# Patient Record
Sex: Male | Born: 1985 | Race: White | Hispanic: No | Marital: Single | Smoking: Current some day smoker
Health system: Southern US, Community
[De-identification: ages and names within clinical notes are randomized; demographics above are authoritative.]

## PROBLEM LIST (undated history)

## (undated) DIAGNOSIS — S022XXA Fracture of nasal bones, initial encounter for closed fracture: Secondary | ICD-10-CM

## (undated) DIAGNOSIS — J45909 Unspecified asthma, uncomplicated: Secondary | ICD-10-CM

## (undated) DIAGNOSIS — S0291XA Unspecified fracture of skull, initial encounter for closed fracture: Secondary | ICD-10-CM

## (undated) DIAGNOSIS — K2970 Gastritis, unspecified, without bleeding: Secondary | ICD-10-CM

## (undated) DIAGNOSIS — Z9889 Other specified postprocedural states: Secondary | ICD-10-CM

## (undated) HISTORY — PX: KNEE SURGERY: SHX244

---
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
## 2007-09-30 ENCOUNTER — Encounter: Payer: Self-pay | Admitting: Emergency Medicine

## 2007-09-30 ENCOUNTER — Inpatient Hospital Stay (HOSPITAL_COMMUNITY): Admission: EM | Admit: 2007-09-30 | Discharge: 2007-10-01 | Payer: Self-pay | Admitting: Neurosurgery

## 2007-10-10 ENCOUNTER — Encounter: Admission: RE | Admit: 2007-10-10 | Discharge: 2007-10-10 | Payer: Self-pay | Admitting: Obstetrics and Gynecology

## 2010-02-26 ENCOUNTER — Emergency Department: Payer: Self-pay | Admitting: Internal Medicine

## 2010-05-12 ENCOUNTER — Ambulatory Visit: Payer: Self-pay | Admitting: Family Medicine

## 2010-05-12 DIAGNOSIS — F17200 Nicotine dependence, unspecified, uncomplicated: Secondary | ICD-10-CM | POA: Insufficient documentation

## 2010-06-22 ENCOUNTER — Telehealth (INDEPENDENT_AMBULATORY_CARE_PROVIDER_SITE_OTHER): Payer: Self-pay

## 2010-07-04 NOTE — Assessment & Plan Note (Signed)
Summary: RASH, SORE THROAT/EVM   Vital Signs:  Patient Profile:   25 Years Old Male Height:     64.5 inches Weight:      149 pounds BMI:     25.27 O2 Sat:      100 % O2 treatment:    Room Air Temp:     97.9 degrees F oral Pulse rate:   70 / minute Pulse rhythm:   regular Resp:     18 per minute BP sitting:   134 / 86  (right arm)  Pt. in pain?   no  Vitals Entered By: Levonne Spiller EMT-P (May 12, 2010 4:00 PM)              Is Patient Diabetic? No  Does patient need assistance? Functional Status Self care Ambulation Normal Comments Pt. is a smoker. 1 half packs per day.      Current Allergies: ! * PEANUTSHistory of Present Illness History from: patient Reason for visit: see chief complaint Chief Complaint: cough, rash, sore throat History of Present Illness: This patient is a 25 year old smoker who is presenting today with a couple of complaints.  He says that he was treated for a URI several weeks ago but the symptoms initially started getting better and now they are coming back worse.   He says that he took a course of Zithromax.  He is a smoker and asthmatic and reports that he is wheezing and coughing especially at night.  He says that he is using his albuterol inhaler occasionally but not using it every day.  He says that the inhaler usually does stop the coughing.  He also reports that he has developed  a genital rash.  He reports that he has been having an itchy red irritated rash in the groin between the legs and buttock area. He says that it becomes severely itchy after a shower.  He says that he has been putting some A and D ointment on the area but only minimal relief in symptoms.  He says that he works in a [employer] and he sweats a lot and works for 12 hours at a time.      REVIEW OF SYSTEMS Constitutional Symptoms      Denies fever, chills, night sweats, weight loss, weight gain, and fatigue.  Eyes       Denies change in vision, eye pain, eye  discharge, glasses, contact lenses, and eye surgery. Ear/Nose/Throat/Mouth       Complains of frequent runny nose and sore throat.      Denies hearing loss/aids, change in hearing, ear pain, ear discharge, dizziness, frequent nose bleeds, sinus problems, hoarseness, and tooth pain or bleeding.  Respiratory       Complains of productive cough and asthma.      Denies dry cough, wheezing, shortness of breath, bronchitis, and emphysema/COPD.      Comments: Colored Sputum Cardiovascular       Denies murmurs, chest pain, and tires easily with exhertion.    Gastrointestinal       Denies stomach pain, nausea/vomiting, diarrhea, constipation, blood in bowel movements, and indigestion. Genitourniary       Denies painful urination, kidney stones, and loss of urinary control. Neurological       Complains of headaches.      Denies paralysis, seizures, and fainting/blackouts. Musculoskeletal       Complains of muscle pain and joint pain.      Denies joint stiffness, decreased  range of motion, redness, swelling, muscle weakness, and gout.  Skin       Denies bruising, unusual mles/lumps or sores, and hair/skin or nail changes.  Psych       Denies mood changes, temper/anger issues, anxiety/stress, speech problems, depression, and sleep problems.  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 He is engaged to be married.  He smokes 1/2 packs per day of cigarrettes. No Alcohol useage No Recreational Drug Useage Pt does report a history of cocaine use in the past.  Past Medical History: Asthma Chronic Active Nicotine User Previous Head Injury - Pt describes that he cracked the back of skull in accident and now has muscle spasms in the back of the neck.  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 He is engaged to be married.  He smokes 1/2 packs per day of cigarrettes. No Alcohol useage No Recreational Drug Useage Pt does report a history of cocaine use in the past. Physical Exam General appearance: well developed, well nourished, no acute distress Head: normocephalic, atraumatic Eyes: conjunctivae and lids normal Pupils: equal, round, reactive to light Ears: scarred right TM Nasal: red, boggy, swollen nasal turbinates bilateral Oral/Pharynx: pharyngeal erythema without exudate, uvula midline without deviation Neck: neck supple,  trachea midline, no masses Chest/Lungs: large tattoo on chest wall, no rales, wheezes, or rhonchi bilateral, breath sounds equal without effort Heart: regular rate and  rhythm, no murmur Abdomen: soft, non-tender without obvious organomegaly GU: no abnormal lesions seen  Extremities: normal extremities Neurological: grossly intact and non-focal Skin: candidal yeast rash in the intertriginal area between groin and legs MSE: oriented to time, place, and person Assessment New Problems: ACUTE SINUSITIS, UNSPECIFIED (ICD-461.9) INTERTRIGO, CANDIDAL (ICD-695.89) CIGARETTE SMOKER (ICD-305.1) ACUTE BRONCHITIS (ICD-466.0)   Plan New Medications/Changes: VENTOLIN HFA 108 (90 BASE) MCG/ACT AERS (ALBUTEROL SULFATE) 2 puffs every 4 hours as needed for wheezing, cough and shortness of breath  #1 x 3, 05/12/2010, Clanford Johnson MD DOXYCYCLINE HYCLATE 100 MG TABS (DOXYCYCLINE HYCLATE) take 1 tab by mouth two times a day with food until gone to treat infection  #20 x 0, 05/12/2010, Clanford Johnson MD DELSYM 30 MG/5ML LQCR (DEXTROMETHORPHAN POLISTIREX) take 1 teaspoon by mouth every 12 hours as needed for severe cough  #60 ML x 0, 05/12/2010, Clanford Johnson  MD KETOCONAZOLE 2 % CREA (KETOCONAZOLE) apply to rash twice per day as directed  #60 gm x 1, 05/12/2010, Clanford Johnson MD FLUCONAZOLE 100 MG TABS (FLUCONAZOLE) take 1 tab by mouth daily until completed  #5 x 0, 05/12/2010, Clanford Johnson MD   The patient and/or caregiver has been counseled thoroughly with regard to medications prescribed including dosage, schedule, interactions, rationale for use, and possible side effects and they verbalize understanding.  Diagnoses and expected course of recovery discussed and will return if not improved as expected or if the condition worsens. Patient and/or caregiver verbalized understanding.  Prescriptions: VENTOLIN HFA 108 (90 BASE) MCG/ACT AERS (ALBUTEROL SULFATE) 2 puffs every 4 hours as needed for wheezing, cough and shortness of breath  #1 x 3   Entered and Authorized by:  Standley Dakins MD   Signed by:   Standley Dakins MD on 05/12/2010   Method used:   Electronically to        [facility] [address] (retail)       [address], [address]       [place], [place]  [address]       Ph: [phone]       Fax: [phone]   RxID:   [id number] DOXYCYCLINE HYCLATE 100 MG TABS (DOXYCYCLINE HYCLATE) take 1 tab by mouth two times a day with food until gone to treat infection  #20 x 0   Entered and Authorized by:   Standley Dakins MD   Signed by:   Standley Dakins MD on 05/12/2010   Method used:   Electronically to        [facility] [address] (retail)       [address], [address]       [place], [place]  [address]       Ph: [phone]       Fax: [phone]   RxID:   [id number] DELSYM 30 MG/5ML LQCR (DEXTROMETHORPHAN POLISTIREX) take 1 teaspoon by mouth every 12 hours as needed for severe cough  #60 ML x 0   Entered and Authorized by:   Standley Dakins MD   Signed by:   Standley Dakins MD on 05/12/2010   Method used:   Electronically to        [facility] [address] (retail)       [address], [address]       [place], [place]  [address]       Ph: [phone]       Fax: [phone]   RxID:   [id number] KETOCONAZOLE 2 % CREA (KETOCONAZOLE) apply to rash twice per day as directed  #60 gm x 1   Entered and Authorized by:   Standley Dakins MD   Signed by:   Standley Dakins MD on 05/12/2010   Method used:   Electronically to        [facility] [address] (retail)       [address], [address]       [place], [place]  [address]       Ph: [phone]       Fax: [phone]   RxID:   [id number] FLUCONAZOLE 100 MG TABS (FLUCONAZOLE) take 1 tab by mouth daily until completed  #5 x 0   Entered and Authorized by:   Standley Dakins MD   Signed by:   Standley Dakins MD on 05/12/2010   Method used:   Electronically to        [facility] [address] (retail)       [address], [address]       [place], [place]  [address]       Ph: [phone]       Fax: [phone]   RxID:   [id number]   Patient Instructions: 1)  Tobacco is very bad for your health and your loved ones! You Should stop smoking!. 2)  Stop Smoking Tips: Choose a Quit date. Cut down before the Quit date. decide what you will do as a substitute when you  feel the urge to smoke(gum,toothpick,exercise). 3)  Please call if symptoms are not improving and return to have it rechecked. 4)  The patient was informed that there is no on-call provider or services available at this clinic during off-hours (when the clinic is closed).  If the patient developed a problem or concern that required immediate attention, the patient was advised to go the the nearest available urgent care or emergency department for medical care.  The patient verbalized understanding.    5)  The risks, benefits and possible side effects were clearly explained and discussed with the patient.  The patient verbalized clear  understanding.  The patient was given instructions to return if symptoms don't improve, worsen or new changes develop.  If it is not during clinic hours and the patient cannot get back to this clinic then the patient was told to seek medical care at an available urgent care or emergency department.  The patient verbalized understanding.   6)  Acute sinusitis symptoms for less than 10 days are not helped by antibiotics.Use warm moist compresses, and over the counter decongestants ( only as directed). Call if no improvement in 5-7 days, sooner if increasing pain, fever, or new symptoms. 7)  Acute bronchitis symptoms for less than 10 days are not helped by antibiotics. take over the counter cough medications. call if no improvment in  5-7 days, sooner if increasing cough, fever, or new symptoms( shortness of breath, chest pain).  Medication Administration  Medication # 1:    Medication: ipratropium inhalation sol unit 0.5mg     Diagnosis: asthma    Dose: 1    Route: inhaled    Exp Date: 10/02/2010    Lot #: [id number]    Patient tolerated medication without complications    Given by: Levonne Spiller EMT-P (May 12, 2010 4:21 PM)  Medication # 2:    Medication: ABUTEROL SULFATE 3.0MG     Diagnosis: ASTHMA    Dose: 1    Route: inhaled    Exp Date: 10/02/2010    Lot #: [id number]    Patient tolerated medication without complications    Given by: Levonne Spiller EMT-P (May 12, 2010 4:22 PM) Pt's breathing improved after the treatment and lungs were clear to auscultation.

## 2010-07-04 NOTE — Letter (Signed)
Summary: Out of Work  [facility]  [address]   [place], [place] [address]   Phone: [phone]  Fax: [phone]    May 12, 2010   Employee:  [name] Roth    To Whom It May Concern:   For Medical reasons, please excuse the above named employee from work for the following dates:  Start:   May 12, 2010  End:   May 15, 2010  If you need additional information, please feel free to contact our office.         Sincerely,    Standley Dakins MD

## 2010-07-06 NOTE — Progress Notes (Signed)
  Phone Note Call from Patient   Reason for Call: Refill Medication Summary of Call: Pt. called and asked if he could get a regular Albuterol inhaler Rx. The Ventolin HFA is fine, However it costs 40.00. Please advise. Initial call taken by: Levonne Spiller EMT-P,  June 22, 2010 10:15 AM  Follow-up for Phone Call        OK to call in albuterol generic. Follow-up by: J. Juline Patch MD,  June 22, 2010 11:08 AM  Additional Follow-up for Phone Call Additional follow up Details #1::        Unable to give regular Albuterol inhaler due to it no longer on the market. Pt. was notified. Additional Follow-up by: Levonne Spiller EMT-P,  June 22, 2010 11:16 AM
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
## 2010-11-01 ENCOUNTER — Ambulatory Visit: Payer: Self-pay

## 2010-11-21 ENCOUNTER — Ambulatory Visit: Payer: Self-pay | Admitting: Unknown Physician Specialty

## 2010-12-13 ENCOUNTER — Emergency Department: Payer: Self-pay | Admitting: Emergency Medicine

## 2010-12-31 ENCOUNTER — Emergency Department: Payer: Self-pay | Admitting: *Deleted

## 2011-02-07 ENCOUNTER — Emergency Department: Payer: Self-pay | Admitting: Emergency Medicine

## 2011-02-08 ENCOUNTER — Inpatient Hospital Stay (HOSPITAL_COMMUNITY)
Admission: AD | Admit: 2011-02-08 | Discharge: 2011-02-12 | DRG: 885 | Disposition: A | Discharge status: Home / Self Care | Payer: Commercial Managed Care - PPO | Source: Ambulatory Visit | Attending: Psychiatry | Admitting: Psychiatry

## 2011-02-08 DIAGNOSIS — G43909 Migraine, unspecified, not intractable, without status migrainosus: Secondary | ICD-10-CM

## 2011-02-08 DIAGNOSIS — F1920 Other psychoactive substance dependence, uncomplicated: Secondary | ICD-10-CM

## 2011-02-08 DIAGNOSIS — Z91010 Allergy to peanuts: Secondary | ICD-10-CM

## 2011-02-08 DIAGNOSIS — F17200 Nicotine dependence, unspecified, uncomplicated: Secondary | ICD-10-CM

## 2011-02-08 DIAGNOSIS — F3110 Bipolar disorder, current episode manic without psychotic features, unspecified: Secondary | ICD-10-CM

## 2011-02-08 DIAGNOSIS — F1020 Alcohol dependence, uncomplicated: Secondary | ICD-10-CM

## 2011-02-08 DIAGNOSIS — F410 Panic disorder [episodic paroxysmal anxiety] without agoraphobia: Secondary | ICD-10-CM

## 2011-02-08 DIAGNOSIS — F429 Obsessive-compulsive disorder, unspecified: Secondary | ICD-10-CM

## 2011-02-08 DIAGNOSIS — Z87892 Personal history of anaphylaxis: Secondary | ICD-10-CM

## 2011-02-08 DIAGNOSIS — F0781 Postconcussional syndrome: Secondary | ICD-10-CM

## 2011-02-08 DIAGNOSIS — J45909 Unspecified asthma, uncomplicated: Secondary | ICD-10-CM
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
## 2011-11-30 ENCOUNTER — Emergency Department (HOSPITAL_COMMUNITY)
Admission: EM | Admit: 2011-11-30 | Discharge: 2011-11-30 | Disposition: A | Discharge status: Home / Self Care | Payer: Commercial Managed Care - PPO | Attending: Emergency Medicine | Admitting: Emergency Medicine

## 2011-11-30 ENCOUNTER — Emergency Department (HOSPITAL_COMMUNITY): Payer: Commercial Managed Care - PPO

## 2011-11-30 ENCOUNTER — Encounter (HOSPITAL_COMMUNITY): Payer: Self-pay | Admitting: *Deleted

## 2011-11-30 DIAGNOSIS — F17200 Nicotine dependence, unspecified, uncomplicated: Secondary | ICD-10-CM | POA: Insufficient documentation

## 2011-11-30 DIAGNOSIS — Z91010 Allergy to peanuts: Secondary | ICD-10-CM | POA: Insufficient documentation

## 2011-11-30 DIAGNOSIS — J45909 Unspecified asthma, uncomplicated: Secondary | ICD-10-CM | POA: Insufficient documentation

## 2011-11-30 DIAGNOSIS — S61209A Unspecified open wound of unspecified finger without damage to nail, initial encounter: Secondary | ICD-10-CM | POA: Insufficient documentation

## 2011-11-30 DIAGNOSIS — S6000XA Contusion of unspecified finger without damage to nail, initial encounter: Secondary | ICD-10-CM

## 2011-11-30 DIAGNOSIS — S61219A Laceration without foreign body of unspecified finger without damage to nail, initial encounter: Secondary | ICD-10-CM

## 2011-11-30 HISTORY — DX: Unspecified asthma, uncomplicated: J45.909

## 2011-11-30 HISTORY — DX: Unspecified fracture of skull, initial encounter for closed fracture: S02.91XA

## 2011-11-30 HISTORY — DX: Other specified postprocedural states: Z98.890

## 2011-11-30 HISTORY — DX: Fracture of nasal bones, initial encounter for closed fracture: S02.2XXA

## 2011-11-30 MED ORDER — HYDROCODONE-ACETAMINOPHEN 5-325 MG PO TABS
2.0000 | ORAL_TABLET | Freq: Once | ORAL | Status: AC
  Administered 2011-11-30: 2 via ORAL
  Filled 2011-11-30: qty 2
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
## 2011-11-30 NOTE — ED Notes (Signed)
Patient is sleeping well. Wakes up to deep stimuli. Patient is able to stand and get into wheelchair. The patient is d/c'd to home with mother
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
## 2014-12-12 ENCOUNTER — Emergency Department (HOSPITAL_COMMUNITY): Payer: Self-pay

## 2014-12-12 ENCOUNTER — Emergency Department (HOSPITAL_COMMUNITY)
Admission: EM | Admit: 2014-12-12 | Discharge: 2014-12-12 | Disposition: A | Discharge status: Home / Self Care | Payer: Self-pay | Attending: Emergency Medicine | Admitting: Emergency Medicine

## 2014-12-12 ENCOUNTER — Emergency Department (HOSPITAL_COMMUNITY): Payer: Commercial Managed Care - PPO

## 2014-12-12 ENCOUNTER — Encounter (HOSPITAL_COMMUNITY): Payer: Self-pay | Admitting: Emergency Medicine

## 2014-12-12 DIAGNOSIS — Z720 Tobacco use: Secondary | ICD-10-CM | POA: Insufficient documentation

## 2014-12-12 DIAGNOSIS — Z9889 Other specified postprocedural states: Secondary | ICD-10-CM | POA: Insufficient documentation

## 2014-12-12 DIAGNOSIS — R109 Unspecified abdominal pain: Secondary | ICD-10-CM

## 2014-12-12 DIAGNOSIS — F1010 Alcohol abuse, uncomplicated: Secondary | ICD-10-CM | POA: Insufficient documentation

## 2014-12-12 DIAGNOSIS — J45909 Unspecified asthma, uncomplicated: Secondary | ICD-10-CM | POA: Insufficient documentation

## 2014-12-12 DIAGNOSIS — Z8781 Personal history of (healed) traumatic fracture: Secondary | ICD-10-CM | POA: Insufficient documentation

## 2014-12-12 DIAGNOSIS — K2920 Alcoholic gastritis without bleeding: Secondary | ICD-10-CM | POA: Insufficient documentation

## 2014-12-12 DIAGNOSIS — K759 Inflammatory liver disease, unspecified: Secondary | ICD-10-CM | POA: Insufficient documentation

## 2014-12-12 LAB — URINALYSIS, ROUTINE W REFLEX MICROSCOPIC
Bilirubin Urine: NEGATIVE
Glucose, UA: NEGATIVE mg/dL
Hgb urine dipstick: NEGATIVE
LEUKOCYTES UA: NEGATIVE
NITRITE: NEGATIVE
SPECIFIC GRAVITY, URINE: 1.015 (ref 1.005–1.030)
UROBILINOGEN UA: 0.2 mg/dL (ref 0.0–1.0)
pH: 7 (ref 5.0–8.0)

## 2014-12-12 LAB — COMPREHENSIVE METABOLIC PANEL
ALBUMIN: 4.2 g/dL (ref 3.5–5.0)
ALK PHOS: 74 U/L (ref 38–126)
ALT: 121 U/L — ABNORMAL HIGH (ref 17–63)
ANION GAP: 11 (ref 5–15)
AST: 61 U/L — ABNORMAL HIGH (ref 15–41)
BUN: 8 mg/dL (ref 6–20)
CO2: 24 mmol/L (ref 22–32)
CREATININE: 0.81 mg/dL (ref 0.61–1.24)
Calcium: 9.1 mg/dL (ref 8.9–10.3)
Chloride: 101 mmol/L (ref 101–111)
GFR calc Af Amer: >60 mL/min (ref >60)
GFR calc non Af Amer: >60 mL/min (ref >60)
Glucose, Bld: 192 mg/dL — ABNORMAL HIGH (ref 65–99)
POTASSIUM: 3.9 mmol/L (ref 3.5–5.1)
Sodium: 136 mmol/L (ref 135–145)
TOTAL PROTEIN: 8.1 g/dL (ref 6.5–8.1)
Total Bilirubin: 0.4 mg/dL (ref 0.3–1.2)

## 2014-12-12 LAB — LIPASE, BLOOD: Lipase: 17 U/L — ABNORMAL LOW (ref 22–51)

## 2014-12-12 LAB — CBC WITH DIFFERENTIAL/PLATELET
BASOS PCT: 1 % (ref 0–1)
Basophils Absolute: 0.1 10*3/uL (ref 0.0–0.1)
EOS ABS: 0.1 10*3/uL (ref 0.0–0.7)
Eosinophils Relative: 1 % (ref 0–5)
HCT: 47.1 % (ref 39.0–52.0)
Hemoglobin: 16.7 g/dL (ref 13.0–17.0)
LYMPHS ABS: 2.6 10*3/uL (ref 0.7–4.0)
Lymphocytes Relative: 21 % (ref 12–46)
MCH: 32.3 pg (ref 26.0–34.0)
MCHC: 35.5 g/dL (ref 30.0–36.0)
MCV: 91.1 fL (ref 78.0–100.0)
MONO ABS: 0.6 10*3/uL (ref 0.1–1.0)
Monocytes Relative: 5 % (ref 3–12)
NEUTROS ABS: 8.8 10*3/uL — AB (ref 1.7–7.7)
NEUTROS PCT: 72 % (ref 43–77)
Platelets: 219 10*3/uL (ref 150–400)
RBC: 5.17 MIL/uL (ref 4.22–5.81)
RDW: 13.5 % (ref 11.5–15.5)
WBC: 12.2 10*3/uL — AB (ref 4.0–10.5)

## 2014-12-12 LAB — URINE MICROSCOPIC-ADD ON

## 2014-12-12 LAB — ETHANOL

## 2014-12-12 MED ORDER — MORPHINE SULFATE 4 MG/ML IJ SOLN
4.0000 mg | Freq: Once | INTRAMUSCULAR | Status: AC
  Administered 2014-12-12: 4 mg via INTRAVENOUS
  Filled 2014-12-12: qty 1

## 2014-12-12 MED ORDER — GI COCKTAIL ~~LOC~~
30.0000 mL | Freq: Once | ORAL | Status: AC
  Administered 2014-12-12: 30 mL via ORAL

## 2014-12-12 MED ORDER — PANTOPRAZOLE SODIUM 40 MG PO TBEC
40.0000 mg | DELAYED_RELEASE_TABLET | Freq: Once | ORAL | Status: AC
  Administered 2014-12-12: 40 mg via ORAL
  Filled 2014-12-12: qty 1

## 2014-12-12 MED ORDER — ONDANSETRON HCL 4 MG/2ML IJ SOLN
4.0000 mg | Freq: Once | INTRAMUSCULAR | Status: DC
  Filled 2014-12-12: qty 2

## 2014-12-12 MED ORDER — SODIUM CHLORIDE 0.9 % IV BOLUS (SEPSIS)
1000.0000 mL | Freq: Once | INTRAVENOUS | Status: AC
  Administered 2014-12-12: 1000 mL via INTRAVENOUS

## 2014-12-12 MED ORDER — OMEPRAZOLE 20 MG PO CPDR: 20.0000 mg | DELAYED_RELEASE_CAPSULE | Freq: Two times a day (BID) | ORAL | Status: DC

## 2014-12-12 MED ORDER — TRAMADOL HCL 50 MG PO TABS
50.0000 mg | ORAL_TABLET | Freq: Four times a day (QID) | ORAL | Status: DC | PRN
Start: 2014-12-12 — End: 2018-11-30

## 2014-12-12 MED ORDER — FAMOTIDINE IN NACL 20-0.9 MG/50ML-% IV SOLN
20.0000 mg | Freq: Once | INTRAVENOUS | Status: AC
  Administered 2014-12-12: 20 mg via INTRAVENOUS
  Filled 2014-12-12: qty 50

## 2014-12-12 MED ORDER — ONDANSETRON HCL 4 MG/2ML IJ SOLN
4.0000 mg | Freq: Once | INTRAMUSCULAR | Status: AC
  Administered 2014-12-12: 4 mg via INTRAVENOUS

## 2014-12-12 MED ORDER — GI COCKTAIL ~~LOC~~
ORAL | Status: AC
  Filled 2014-12-12: qty 30

## 2014-12-12 MED ORDER — PROMETHAZINE HCL 25 MG PO TABS: 25.0000 mg | ORAL_TABLET | Freq: Four times a day (QID) | ORAL | Status: DC | PRN

## 2014-12-12 NOTE — ED Notes (Signed)
Patient with abdominal pain and vomiting x 3 days. States burning chest pain x 2 days. Patient reports hematemesis x 3 days as well. Has been taking pepto-bismol.

## 2014-12-12 NOTE — ED Notes (Signed)
C/o pain coming back after ultrasound.  Orders received.

## 2014-12-12 NOTE — Discharge Instructions (Signed)
Gastritis, Adult Gastritis is soreness and swelling (inflammation) of the lining of the stomach. Gastritis can develop as a sudden onset (acute) or long-term (chronic) condition. If gastritis is not treated, it can lead to stomach bleeding and ulcers. CAUSES  Gastritis occurs when the stomach lining is weak or damaged. Digestive juices from the stomach then inflame the weakened stomach lining. The stomach lining may be weak or damaged due to viral or bacterial infections. One common bacterial infection is the Helicobacter pylori infection. Gastritis can also result from excessive alcohol consumption, taking certain medicines, or having too much acid in the stomach.  SYMPTOMS  In some cases, there are no symptoms. When symptoms are present, they may include:  Pain or a burning sensation in the upper abdomen.  Nausea.  Vomiting.  An uncomfortable feeling of fullness after eating. DIAGNOSIS  Your caregiver may suspect you have gastritis based on your symptoms and a physical exam. To determine the cause of your gastritis, your caregiver may perform the following:  Blood or stool tests to check for the H pylori bacterium.  Gastroscopy. A thin, flexible tube (endoscope) is passed down the esophagus and into the stomach. The endoscope has a light and camera on the end. Your caregiver uses the endoscope to view the inside of the stomach.  Taking a tissue sample (biopsy) from the stomach to examine under a microscope. TREATMENT  Depending on the cause of your gastritis, medicines may be prescribed. If you have a bacterial infection, such as an H pylori infection, antibiotics may be given. If your gastritis is caused by too much acid in the stomach, H2 blockers or antacids may be given. Your caregiver may recommend that you stop taking aspirin, ibuprofen, or other nonsteroidal anti-inflammatory drugs (NSAIDs). HOME CARE INSTRUCTIONS  Only take over-the-counter or prescription medicines as directed by  your caregiver.  If you were given antibiotic medicines, take them as directed. Finish them even if you start to feel better.  Drink enough fluids to keep your urine clear or pale yellow.  Avoid foods and drinks that make your symptoms worse, such as:  Caffeine or alcoholic drinks.  Chocolate.  Peppermint or mint flavorings.  Garlic and onions.  Spicy foods.  Citrus fruits, such as oranges, lemons, or limes.  Tomato-based foods such as sauce, chili, salsa, and pizza.  Fried and fatty foods.  alcohol  Eat small, frequent meals instead of large meals. SEEK IMMEDIATE MEDICAL CARE IF:   You have black or dark red stools.  You vomit blood or material that looks like coffee grounds.  You are unable to keep fluids down.  Your abdominal pain gets worse.  You have a fever.  You do not feel better after 1 week.  You have any other questions or concerns. MAKE SURE YOU:  Understand these instructions.  Will watch your condition.  Will get help right away if you are not doing well or get worse. Document Released: 05/15/2001 Document Revised: 11/20/2011 Document Reviewed: 07/04/2011 [facility] Patient Information 2015 [place], [place]. This information is not intended to replace advice given to you by your health care provider. Make sure you discuss any questions you have with your health care provider.    In addition to the prilosec (which should help heal your stomach lining) you may also use maalox or mylanta for additional gastritis relief.   Call your primary doctor for a recheck of your symptoms this week.  You may need to see a Gastroenterologist for further tests if your  symptoms persist.  See the resource guide below if you feel you have a problem with alcohol and are interested in assistance with this problem.    Emergency Department Resource Guide 1) Find a Doctor and Pay Out of Pocket Although you won't have to find out who is covered by your insurance plan, it  is a good idea to ask around and get recommendations. You will then need to call the office and see if the doctor you have chosen will accept you as a new patient and what types of options they offer for patients who are self-pay. Some doctors offer discounts or will set up payment plans for their patients who do not have insurance, but you will need to ask so you aren't surprised when you get to your appointment.  2) Contact Your Local Health Department Not all health departments have doctors that can see patients for sick visits, but many do, so it is worth a call to see if yours does. If you don't know where your local health department is, you can check in your phone book. The CDC also has a tool to help you locate your state's health department, and many state websites also have listings of all of their local health departments.  3) Find a Walk-in Clinic If your illness is not likely to be very severe or complicated, you may want to try a walk in clinic. These are popping up all over the country in pharmacies, drugstores, and shopping centers. They're usually staffed by nurse practitioners or physician assistants that have been trained to treat common illnesses and complaints. They're usually fairly quick and inexpensive. However, if you have serious medical issues or chronic medical problems, these are probably not your best option.  No Primary Care Doctor: - Call Health Connect at  [phone] - they can help you locate a primary care doctor that  accepts your insurance, provides certain services, etc. - Physician Referral Service- [phone]  Chronic Pain Problems: Organization         Address  Phone   Notes  [facility]  [phone] Patients need to be referred by their primary care doctor.   Medication Assistance: Organization         Address  Phone   Notes  [facility] Medication [facility] [address] [place], [place] [address] [phone] --Must be a resident of [facility] -- Must have NO insurance coverage whatsoever (no Medicaid/ Medicare, etc.) -- The pt. MUST have a primary care doctor that directs their care regularly and follows them in the community   [facility]  [phone]   [employer]  [phone]    Agencies that provide inexpensive medical care: Organization         Address  Phone   Notes  [facility]  [phone]   [facility]    [phone]   [facility] [address] [place], [place] [address] [phone]   [facility] [address] [place], [place] [phone]   Planned Parenthood    [phone]   [facility]    [phone]   [facility]  [address], [place] Phone:  [phone], Fax:  [phone] Hours of Operation:  9 am - 6 pm, M-F.  Also accepts Medicaid/Medicare and self-pay.  [facility]  [address], [place] Phone: [phone], Fax: [phone]. Hours of Operation:  8:30 am - 5:30 pm, M-F.  Also accepts Medicaid and self-pay.  [facility] [address], [place] Phone: [phone]   [place], [place], [place] [phone], Ext. [phone] Mondays & Thursdays: 7-9 AM.  First 15 patients are seen on a first come, first serve basis.    [place] Providers:  Organization         Address  Phone   Notes  [facility] [address], [place] [phone] Also accepts self-pay patients.  [facility] [address] [place] [address], [place]  [phone]   [place], [address], [place] [phone]   [facility] [address] [place] [phone]   Lucianne Lei [address], [place]   [phone] Only accepts [place] Access [place] patients after they have their name applied to their card.   Self-Pay (no insurance) in [facility]:  Organization         Address  Phone   Notes  Sickle Cell Patients, [facility] [place] [phone]   [facility] [place] [phone]   [facility] [place]  [place], [place], [place] [phone]   [facility]/Dr. Osei-Bonsu  [address] [place] or [address], [place] [phone] Phone number for both [place] and [place] locations is the same.  [facility] [address], [place] [phone]   [facility] [address], [place] or [address] [phone]   [facility] [address] [place] [phone], phone; [phone], fax Sees patients 1st and 3rd Saturday of every month.  Must not qualify for public or private insurance (i.e. Medicaid, Medicare, [place] Health Choice, Veterans' Benefits)  Household income should be no more than 200% of the poverty level The clinic cannot treat you if you are pregnant or think you are pregnant  Sexually transmitted diseases are not treated at the clinic.    Dental Care: Organization         Address  Phone  Notes  [facility] Department of [facility] [phone] Accepts children up to age 82 who are enrolled in [place] or [place]; pregnant women with a Medicaid card; and children who have applied for Medicaid or [place] Health Choice, but were declined, whose parents can pay a reduced fee at time of service.  [facility] Department of [facility]  [address], [place] [phone] Accepts children up to age 75 who are enrolled in [place] or [place]; pregnant women with a Medicaid card; and children who have applied for Medicaid or [place] Health Choice, but were declined, whose parents can pay a reduced fee at time of service.  [facility]  [place] [phone] Patients are seen by appointment only. Walk-ins are not accepted. [place] will see patients 68 years of age and older. Monday - Tuesday (8am-5pm) Most Wednesdays (8:30-5pm) $30 per visit, cash only  [place]  [address], [facility] [phone] Patients are seen by appointment only. Walk-ins are not accepted. [place] will see patients 41 years of age and older. One Wednesday Evening (Monthly: Volunteer Based).  $30 per visit, cash only  [place]  [phone] for adults; Children under age 20, call Graduate Pediatric Dentistry at [phone]. Children aged 80-14, please call [phone] to request a pediatric application.  Dental services are provided in all areas of dental care including fillings, crowns and bridges, complete and partial dentures, implants, gum treatment, root canals, and extractions. Preventive care is also provided. Treatment is provided to both adults and children. Patients are selected via a lottery and there is often a waiting list.   [facility] [address] [place]  [phone] [email or web address]   [facility] [address] [place], [place] [phone], Ext. [phone] Second and Fourth Thursday of each month, opens at 6:30 AM; Clinic ends at 9 AM.  Patients are seen on a first-come first-served basis, and a limited number are seen during each clinic.   [facility]  [address] Hillard Danker [place], [place] [phone]   Eligibility Requirements You must have lived in [place], [place], or [place] counties for at least the last three months.   You cannot be eligible for state or  federal sponsored [employer], including [employer], [place], or [employer].   You generally cannot be eligible for healthcare insurance through your employer.    How to apply: Eligibility screenings are held every Tuesday and Wednesday afternoon from 1:00 pm until 4:00 pm. You do not need an appointment for the interview!  [facility] [address] [place], [place]   [place]  [place] Department  [place]  [phone]    Behavioral Health Resources in the Community: Intensive Outpatient Programs Organization         Address  Phone  Notes  [place]. [address], [place], [place] [phone]   [facility] [address] [place], [place]   ADS: Alcohol & Drug Svcs [address], [place], [place]   [place] [address]  [place], [place] or [phone]   Substance Abuse Resources Organization         Address  Phone  Notes  Alcohol and Drug Services  [phone]   [place]  [phone]   [facility] [place]   Chinita Pester  [phone]   Residential & Outpatient Substance Abuse Program  [phone]   Psychological Services Organization         Address  Phone  Notes  [facility] [place]  [place]  [phone]   [place] [address], [place] or [phone]    Mobile Crisis Teams Organization         Address  Phone  Notes  [facility], Mobile Crisis Care Unit  [phone]   [facility]  [address] [place], [place]   [place] [address], [place] [phone]    Self-Help/Support Groups Organization         Address  Phone              Notes  [place]. of [place] -  variety of support groups  336- ([phone] Call for more information  Narcotics Anonymous (NA), [facility] [address], [employer] [place]  2 meetings at this location   Residential Sports administrator         Address  Phone  Notes  [facility] [address],    [place]  [phone]   [facility]  [address], [place] [address], [place], [place] [phone]   [facility] [address] [place], [place] [phone] Admissions: 8am-3pm M-F  [facility] [address]    [place], [place] [phone]   [facility] [address] [place], [place], [place] [phone]   [facility] [address]  [place], [place] [phone]   [facility] - Intensive Outpatient [address], [place], [place] [phone]   [facility] ([facility] [address]  [place], [place] [phone] or [phone]   [facility] (RTS) [address], [place], [place] [phone] Accepts Medicaid  [facility] [address],  [place] [phone] Substance Abuse/Addiction Treatment   [facility] Organization         Address  Phone  Notes  [facility]  [phone]   Angie Fava, PhD [address] Ervin Knack [place], [place]   [phone] or [phone]   [facility] Behavioral   [address] [place], [place] [phone]   [facility] [address], [place], [place] [phone] Insurance/Medicaid/sponsorship through [facility] and Families [address]                                    [place], [place] [phone] Therapy/tele-psych/case  [facility] [address], [place] [phone]    Dr. Lolly Mustache  [phone]   [facility]  [employer] [facility]  Dept. 1) [address] [place] 2) [address] [place] 3)  [address], [place] [phone]  [phone]   [facility] Child Abuse Hotline [phone])[phone] or [phone] (After Hours)

## 2014-12-12 NOTE — ED Provider Notes (Signed)
CSN: [id number]     Arrival date & time 12/12/14  0845 History   First MD Initiated Contact with Patient 12/12/14 0900     Chief Complaint  Patient presents with  . Abdominal Pain     (Consider location/radiation/quality/duration/timing/severity/associated sxs/prior Treatment) The history is provided by the patient and a friend.   [name] Roth is a 29 y.o. male the past medical history of asthma and history of EtOH abuse, although denies this currently, presenting with a three-day history of upper abdominal pain along with nausea and vomiting.  He describes midsternal chest burning pain in association with vomiting.  He has been vomiting clear liquid, however has had episodic hematemesis as well.  He has tried Pepto-Bismol without relief.  He has had no fevers or chills.  Pain is constant in his upper abdomen and radiates into his back.  He reports increased personal stressors this week hence increased alcohol use.  He states he has had no less than 10 shots of alcohol daily for the past week, last use was yesterday.  He denies history of pancreatitis.    Past Medical History  Diagnosis Date  . Skull fracture   . Broken nose   . History of right knee surgery   . Asthma    Past Surgical History  Procedure Laterality Date  . Knee surgery      right   No family history on file. History  Substance Use Topics  . Smoking status: Current Some Day Smoker -- 1.00 packs/day    Types: Cigarettes  . Smokeless tobacco: Not on file  . Alcohol Use: Yes     Comment: states daily drinking    Review of Systems  Constitutional: Negative for fever.  HENT: Negative for congestion and sore throat.   Eyes: Negative.   Respiratory: Negative for chest tightness and shortness of breath.   Cardiovascular: Negative for chest pain.  Gastrointestinal: Positive for nausea, vomiting and abdominal pain. Negative for diarrhea, constipation and blood in stool.       Episodic hematemesis  Genitourinary:  Negative.   Musculoskeletal: Negative for joint swelling, arthralgias and neck pain.  Skin: Negative.  Negative for rash and wound.  Neurological: Positive for weakness. Negative for dizziness, light-headedness, numbness and headaches.  Psychiatric/Behavioral: Negative.       Allergies  Peanut-containing drug products  Home Medications   Prior to Admission medications   Medication Sig Start Date End Date Taking? Authorizing Provider  ibuprofen (ADVIL,MOTRIN) 200 MG tablet Take 400 mg by mouth every 6 (six) hours as needed for moderate pain.   Yes Historical Provider, MD  omeprazole (PRILOSEC) 20 MG capsule Take 1 capsule (20 mg total) by mouth 2 (two) times daily before a meal. 12/12/14   Burgess AmorJulie Tidus [place], PA-C  promethazine (PHENERGAN) 25 MG tablet Take 1 tablet (25 mg total) by mouth every 6 (six) hours as needed for nausea or vomiting. 12/12/14   Burgess AmorJulie Jandel [place], PA-C  traMADol (ULTRAM) 50 MG tablet Take 1 tablet (50 mg total) by mouth every 6 (six) hours as needed. 12/12/14   Burgess AmorJulie Jarita Raval, PA-C   BP 125/78 mmHg  Pulse 69  Temp(Src) 99.2 F (37.3 C) (Oral)  Resp 16  Ht 5\' 6"  (1.676 m)  Wt 145 lb (65.772 kg)  BMI 23.41 kg/m2  SpO2 97% Physical Exam  Constitutional: He appears well-developed and well-nourished.  Actively retching with clear liquid vomitus  HENT:  Head: Normocephalic and atraumatic.  Neck: Normal range of motion. Neck supple.  Cardiovascular: Normal rate, regular rhythm, normal heart sounds and intact distal pulses.   Pulmonary/Chest: Effort normal and breath sounds normal. He has no wheezes.  Abdominal: Soft. Bowel sounds are normal. He exhibits no distension. There is generalized tenderness. There is no guarding.  Patient endorses pain is worse in his left upper abdomen.  On exam he has generalized tenderness without guarding.  Musculoskeletal: Normal range of motion.  Neurological: He is alert.  Skin: Skin is warm and dry.  Psychiatric: He has a normal mood and affect.   Nursing note and vitals reviewed.   ED Course  Procedures (including critical care time) Labs Review Labs Reviewed  CBC WITH DIFFERENTIAL/PLATELET - Abnormal; Notable for the following:    WBC 12.2 (*)    Neutro Abs 8.8 (*)    All other components within normal limits  COMPREHENSIVE METABOLIC PANEL - Abnormal; Notable for the following:    Glucose, Bld 192 (*)    AST 61 (*)    ALT 121 (*)    All other components within normal limits  LIPASE, BLOOD - Abnormal; Notable for the following:    Lipase 17 (*)    All other components within normal limits  URINALYSIS, ROUTINE W REFLEX MICROSCOPIC (NOT AT [facility]) - Abnormal; Notable for the following:    Ketones, ur TRACE (*)    Protein, ur TRACE (*)    All other components within normal limits  ETHANOL  URINE MICROSCOPIC-ADD ON    Imaging Review US Abdomen Complete  12/12/2014   CLINICAL DATA:  29 year old male with acute abdominal pain and vomiting.  EXAM: ULTRASOUND ABDOMEN COMPLETE  COMPARISON:  None.  FINDINGS: Gallbladder: Gallbladder is unremarkable. There is no evidence of cholelithiasis or acute cholecystitis.  Common bile duct: Diameter: 3.8 mm. There is no evidence of intrahepatic or extrahepatic biliary dilatation. The visualized CBD is unremarkable.  Liver: No focal lesion identified. Within normal limits in parenchymal echogenicity.  IVC: No abnormality visualized.  Pancreas: Visualized portion unremarkable.  Spleen: Size and appearance within normal limits.  Right Kidney: Length: 12.4 cm. Echogenicity within normal limits. No mass or hydronephrosis visualized.  Left Kidney: Length: 11.9 cm. Echogenicity within normal limits. No mass or hydronephrosis visualized.  Abdominal aorta: No aneurysm visualized.  Other findings: None.  IMPRESSION: Normal abdominal ultrasound.   Electronically Signed   By: Harmon Pier M.D.   On: 12/12/2014 11:48   Dg Abd Acute W/chest  12/12/2014   CLINICAL DATA:  Patient with abdominal pain, fever and  sore throat.  EXAM: DG ABDOMEN ACUTE W/ 1V CHEST  COMPARISON:  Chest radiograph 02/26/2010  FINDINGS: Stable cardiac and mediastinal contours. No consolidative pulmonary opacities. No pleural effusion or pneumothorax.  There is a large amount of stool throughout the colon. Gas is demonstrated within nondilated loops of large and small bowel. Erect imaging demonstrates no free intraperitoneal air. Radiodensities projecting over the right upper quadrant may represent cholelithiasis. Osseous skeleton is unremarkable.  IMPRESSION: Large amount of stool within the colon as can be seen with constipation. No evidence for small bowel obstruction.  Calcifications within the right upper quadrant may represent cholelithiasis. Consider correlation with right upper quadrant ultrasound. Additionally these may potentially be renal in etiology.   Electronically Signed   By: Annia Belt M.D.   On: 12/12/2014 11:26     EKG Interpretation None      MDM   Final diagnoses:  Abdominal pain  Gastritis, alcoholic  Hepatitis    Patients labs and/or radiological studies  were reviewed and considered during the medical decision making and disposition process.  Results were also discussed with patient.  Patient was given IV fluids along with morphine, IV Pepcid and Zofran he obtained significant improvement in his symptoms including no further emesis while here.  He described continued burning sensation in his throat, GI cocktail was given and he would then was able to tolerate by mouth fluids.  History and symptoms consistent with alcohol-induced gastritis.  His LFTs are also elevated at this time.  He was prescribed Phenergan, Prilosec and a few tramadol tablets for home use.  Discussed alcohol cessation, referrals given.  He is also given referral for obtaining a PCP and was encouraged to make this happen as soon as possible.  In the interim he was advised to return here for any return of symptoms or worsening symptoms.   Patient was felt stable at time of discharge and he was feeling much better and was ready for discharge home.    Burgess Amor, PA-C 12/13/14 0840  Vanetta Mulders, MD 12/15/14 [id number]

## 2015-03-02 ENCOUNTER — Emergency Department (HOSPITAL_COMMUNITY): Payer: Commercial Managed Care - PPO

## 2015-03-02 ENCOUNTER — Encounter (HOSPITAL_COMMUNITY): Payer: Self-pay

## 2015-03-02 ENCOUNTER — Emergency Department (HOSPITAL_COMMUNITY)
Admission: EM | Admit: 2015-03-02 | Discharge: 2015-03-02 | Discharge status: Against Medical Advice | Payer: Commercial Managed Care - PPO | Attending: Emergency Medicine | Admitting: Emergency Medicine

## 2015-03-02 DIAGNOSIS — F1310 Sedative, hypnotic or anxiolytic abuse, uncomplicated: Secondary | ICD-10-CM | POA: Insufficient documentation

## 2015-03-02 DIAGNOSIS — Z79899 Other long term (current) drug therapy: Secondary | ICD-10-CM | POA: Insufficient documentation

## 2015-03-02 DIAGNOSIS — J45909 Unspecified asthma, uncomplicated: Secondary | ICD-10-CM | POA: Insufficient documentation

## 2015-03-02 DIAGNOSIS — R4781 Slurred speech: Secondary | ICD-10-CM | POA: Insufficient documentation

## 2015-03-02 DIAGNOSIS — R000 Tachycardia, unspecified: Secondary | ICD-10-CM | POA: Insufficient documentation

## 2015-03-02 DIAGNOSIS — Z720 Tobacco use: Secondary | ICD-10-CM | POA: Insufficient documentation

## 2015-03-02 DIAGNOSIS — Z87828 Personal history of other (healed) physical injury and trauma: Secondary | ICD-10-CM | POA: Insufficient documentation

## 2015-03-02 DIAGNOSIS — F1410 Cocaine abuse, uncomplicated: Secondary | ICD-10-CM | POA: Insufficient documentation

## 2015-03-02 DIAGNOSIS — Z8781 Personal history of (healed) traumatic fracture: Secondary | ICD-10-CM | POA: Insufficient documentation

## 2015-03-02 DIAGNOSIS — Y9389 Activity, other specified: Secondary | ICD-10-CM | POA: Insufficient documentation

## 2015-03-02 DIAGNOSIS — T402X4A Poisoning by other opioids, undetermined, initial encounter: Secondary | ICD-10-CM | POA: Insufficient documentation

## 2015-03-02 DIAGNOSIS — T50904A Poisoning by unspecified drugs, medicaments and biological substances, undetermined, initial encounter: Secondary | ICD-10-CM

## 2015-03-02 DIAGNOSIS — Y998 Other external cause status: Secondary | ICD-10-CM | POA: Insufficient documentation

## 2015-03-02 HISTORY — DX: Gastritis, unspecified, without bleeding: K29.70

## 2015-03-02 LAB — COMPREHENSIVE METABOLIC PANEL
ALBUMIN: 3.8 g/dL (ref 3.5–5.0)
ALK PHOS: 57 U/L (ref 38–126)
ALT: 216 U/L — AB (ref 17–63)
AST: 207 U/L — ABNORMAL HIGH (ref 15–41)
Anion gap: 11 (ref 5–15)
BUN: 12 mg/dL (ref 6–20)
CALCIUM: 8.4 mg/dL — AB (ref 8.9–10.3)
CHLORIDE: 108 mmol/L (ref 101–111)
CO2: 22 mmol/L (ref 22–32)
CREATININE: 0.92 mg/dL (ref 0.61–1.24)
GFR calc Af Amer: >60 mL/min (ref >60)
GFR calc non Af Amer: >60 mL/min (ref >60)
GLUCOSE: 110 mg/dL — AB (ref 65–99)
Potassium: 3.7 mmol/L (ref 3.5–5.1)
SODIUM: 141 mmol/L (ref 135–145)
Total Bilirubin: 0.5 mg/dL (ref 0.3–1.2)
Total Protein: 6.9 g/dL (ref 6.5–8.1)

## 2015-03-02 LAB — URINALYSIS, ROUTINE W REFLEX MICROSCOPIC
Bilirubin Urine: NEGATIVE
Glucose, UA: NEGATIVE mg/dL
HGB URINE DIPSTICK: NEGATIVE
Ketones, ur: NEGATIVE mg/dL
Leukocytes, UA: NEGATIVE
Nitrite: NEGATIVE
PH: 6 (ref 5.0–8.0)
PROTEIN: NEGATIVE mg/dL
Specific Gravity, Urine: >1.03 — ABNORMAL HIGH (ref 1.005–1.030)
Urobilinogen, UA: 0.2 mg/dL (ref 0.0–1.0)

## 2015-03-02 LAB — ACETAMINOPHEN LEVEL: Acetaminophen (Tylenol), Serum: <10 ug/mL — ABNORMAL LOW (ref 10–30)

## 2015-03-02 LAB — CBC WITH DIFFERENTIAL/PLATELET
BASOS ABS: 0 10*3/uL (ref 0.0–0.1)
BASOS PCT: 0 %
EOS ABS: 0.1 10*3/uL (ref 0.0–0.7)
Eosinophils Relative: 1 %
HCT: 42 % (ref 39.0–52.0)
Hemoglobin: 14.3 g/dL (ref 13.0–17.0)
Lymphocytes Relative: 17 %
Lymphs Abs: 1.5 10*3/uL (ref 0.7–4.0)
MCH: 31.8 pg (ref 26.0–34.0)
MCHC: 34 g/dL (ref 30.0–36.0)
MCV: 93.5 fL (ref 78.0–100.0)
Monocytes Absolute: 1 10*3/uL (ref 0.1–1.0)
Monocytes Relative: 11 %
NEUTROS PCT: 71 %
Neutro Abs: 6.3 10*3/uL (ref 1.7–7.7)
PLATELETS: 174 10*3/uL (ref 150–400)
RBC: 4.49 MIL/uL (ref 4.22–5.81)
RDW: 13.6 % (ref 11.5–15.5)
WBC: 8.8 10*3/uL (ref 4.0–10.5)

## 2015-03-02 LAB — RAPID URINE DRUG SCREEN, HOSP PERFORMED
AMPHETAMINES: NOT DETECTED
BARBITURATES: NOT DETECTED
Benzodiazepines: POSITIVE — AB
Cocaine: POSITIVE — AB
Opiates: POSITIVE — AB
Tetrahydrocannabinol: NOT DETECTED

## 2015-03-02 LAB — SALICYLATE LEVEL: Salicylate Lvl: <4 mg/dL (ref 2.8–30.0)

## 2015-03-02 LAB — TROPONIN I: Troponin I: <0.03 ng/mL (ref <0.031)

## 2015-03-02 LAB — ETHANOL: Alcohol, Ethyl (B): 16 mg/dL — ABNORMAL HIGH (ref <5)

## 2015-03-02 NOTE — ED Notes (Signed)
Patient transported to CT 

## 2015-03-02 NOTE — ED Notes (Addendum)
Pt received  Narcan by EMS for being unresponsive on their arrival - on EMS arrival patient was post CPR that was delivered by girlfriend for being unresponsive. Pt reports hanging out with some friends. Pt reports having a beer earlier today, pt denies being aware that he took any illicit drugs.
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
## 2017-03-10 IMAGING — CT CT HEAD W/O CM
1 series · 16 of 30 positions shown, 20 images · non-contrast
Comparison: December 13, 2010

CLINICAL DATA: Patient is unresponsive.

EXAM:
CT HEAD WITHOUT CONTRAST
TECHNIQUE: Contiguous axial images were obtained from the base of the skull
through the vertex without intravenous contrast.

[Series 2: headseq 4.8 h37s · axial · 0.43mm/px · z∈[+1101,+1255]mm · 16 of 36 slices shown, 20 images]
[im 2/36  brain]
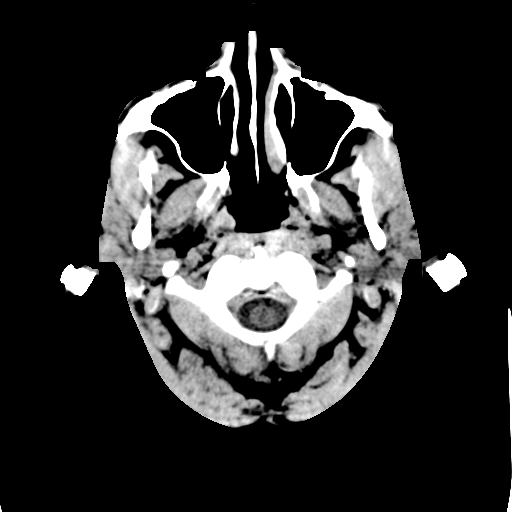
[im 2/36  bone]
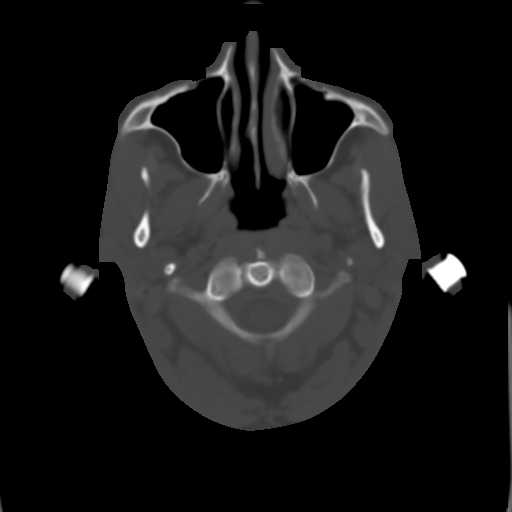
[im 4/36  brain]
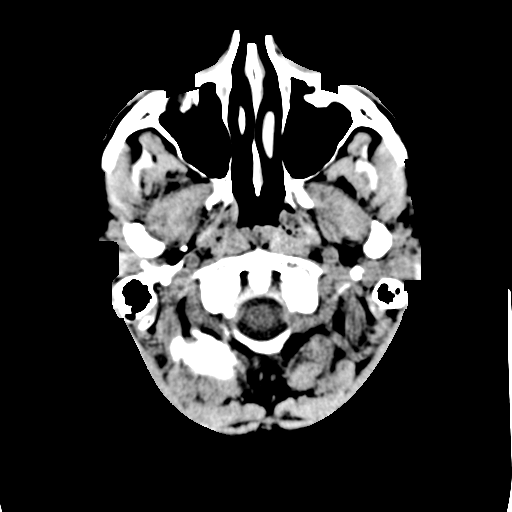
[im 7/36  brain]
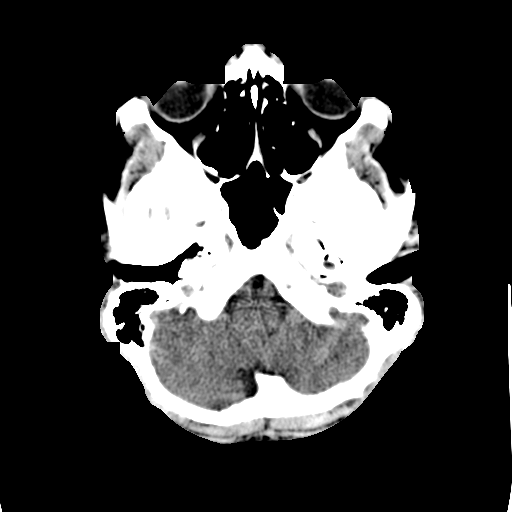
[im 9/36  brain]
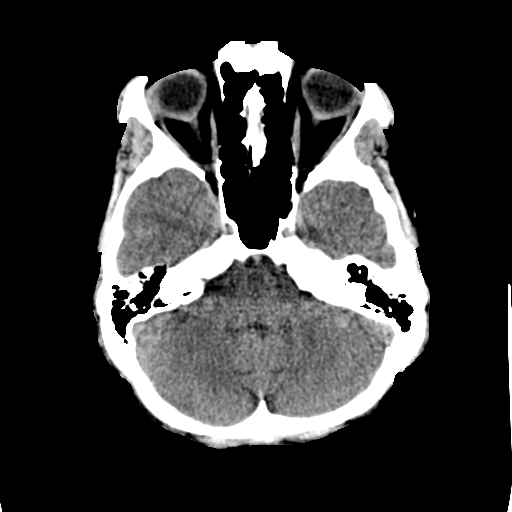
[im 10/36  brain]
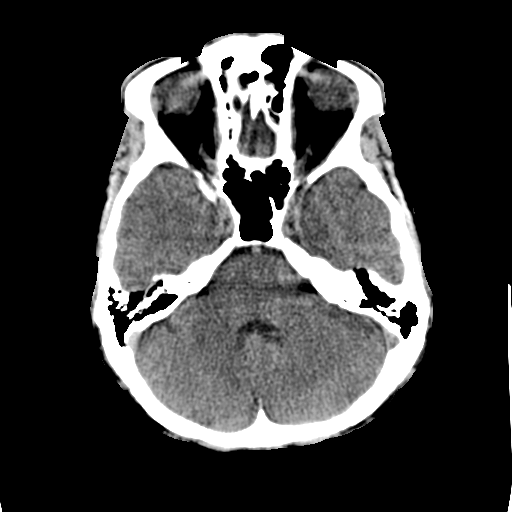
[im 10/36  bone]
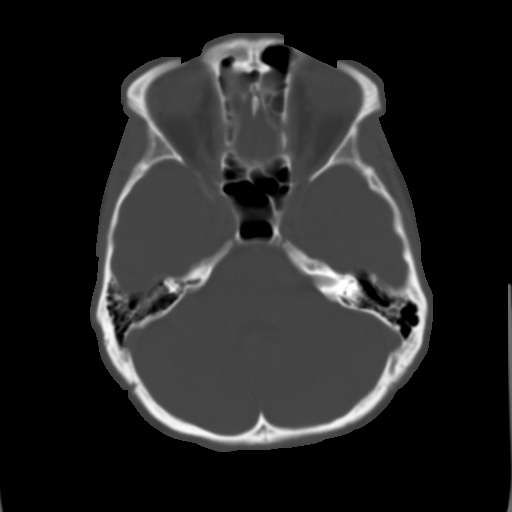
[im 13/36  brain]
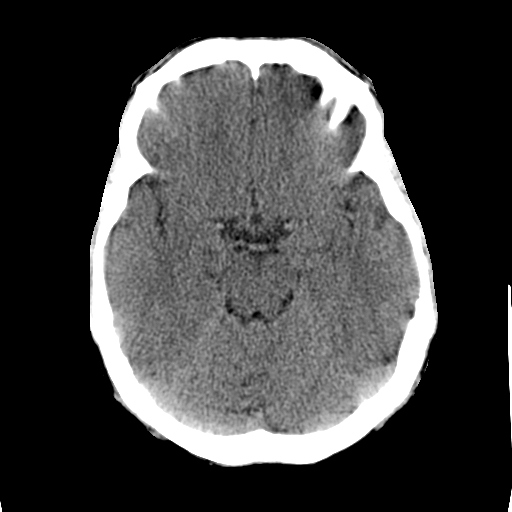
[im 15/36  brain]
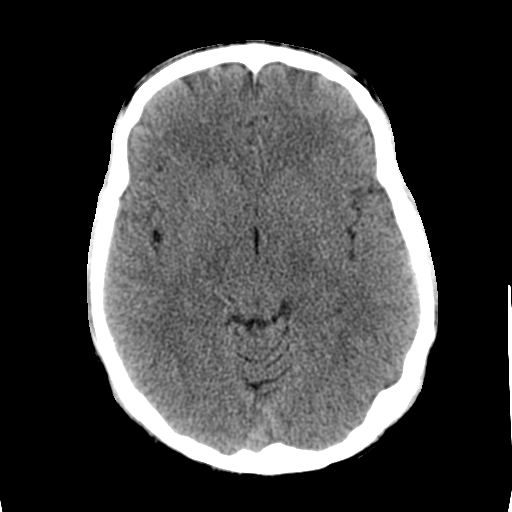
[im 17/36  brain]
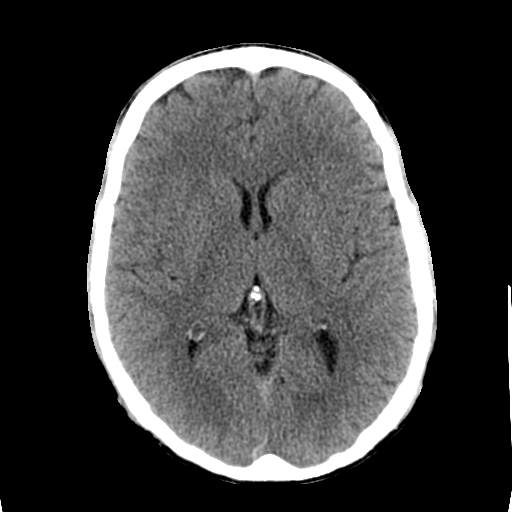
[im 19/36  brain]
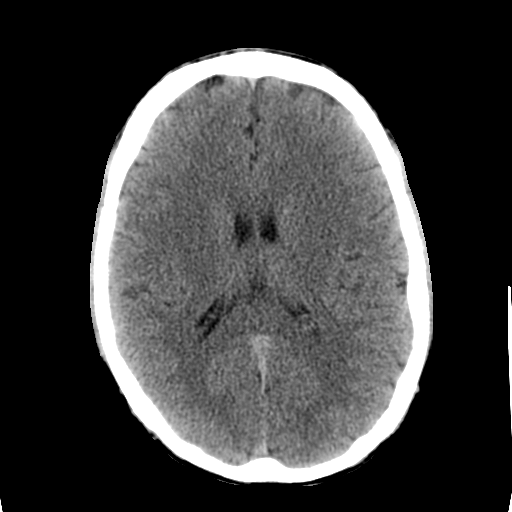
[im 19/36  bone]
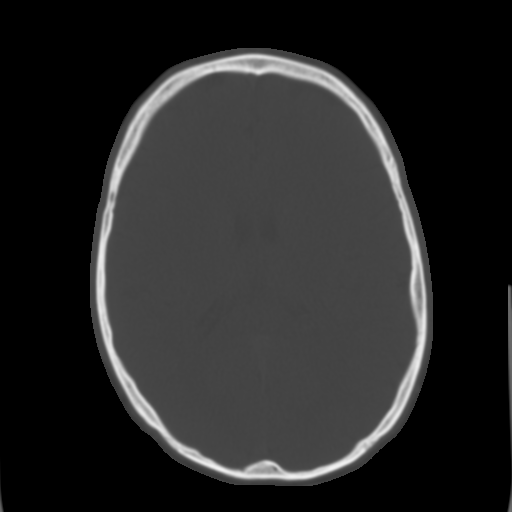
[im 21/36  brain]
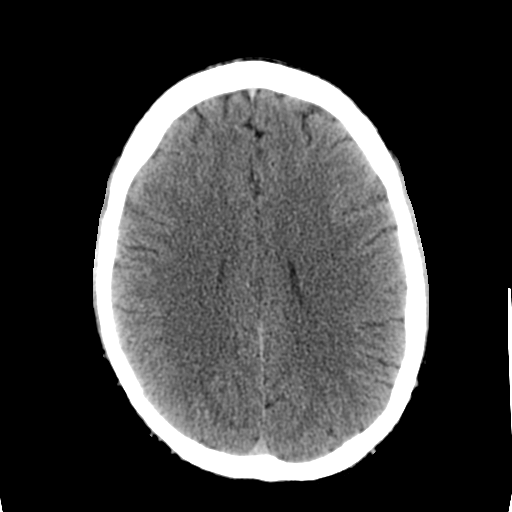
[im 23/36  brain]
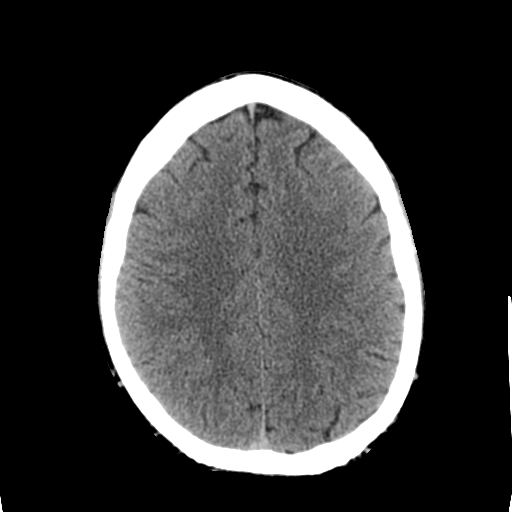
[im 26/36  brain]
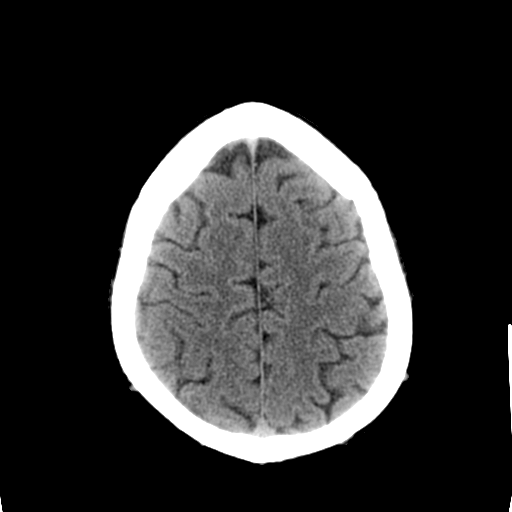
[im 27/36  brain]
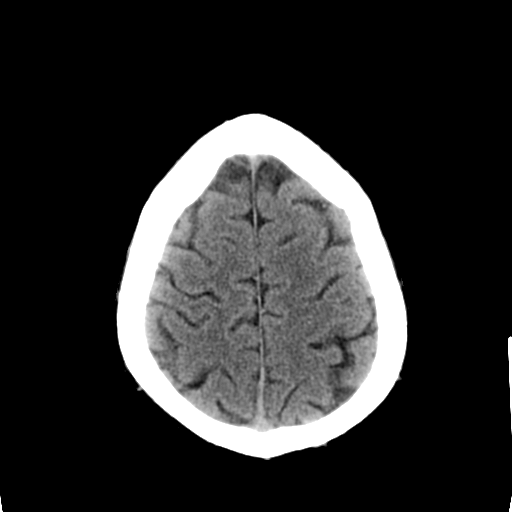
[im 27/36  bone]
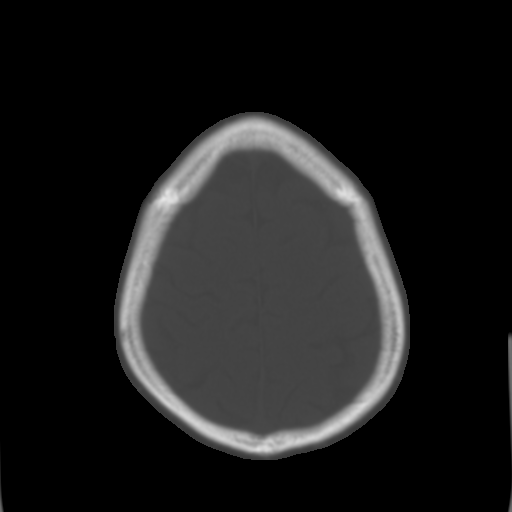
[im 29/36  brain]
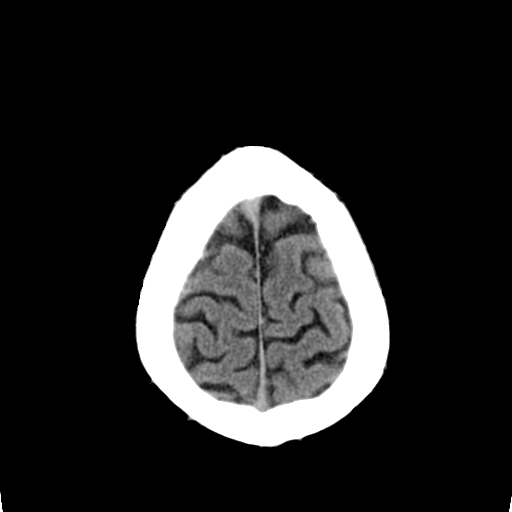
[im 32/36  brain]
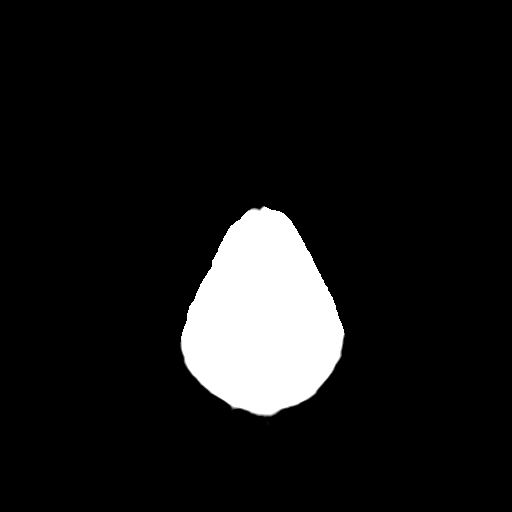
[im 34/36  brain]
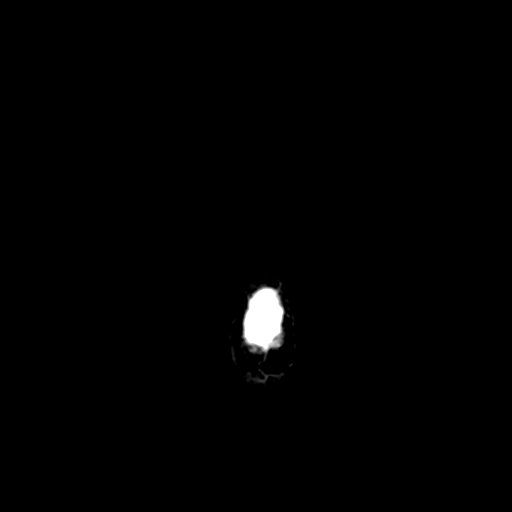

[16 of 30 positions shown; findings below may reference images not displayed]

FINDINGS: There is no midline shift, hydrocephalus, or mass. No acute
hemorrhage or acute transcortical infarct is identified. The bony
calvarium is intact. The visualized sinuses are clear.
IMPRESSION: Normal head CT.

## 2017-03-10 IMAGING — DX DG CHEST 2V
2 series · 2 of 2 positions shown · non-contrast
Comparison: 12/12/2014

CLINICAL DATA: Syncope.  Unresponsive.  Status post CPR.

EXAM:
CHEST  2 VIEW

[chest pa]
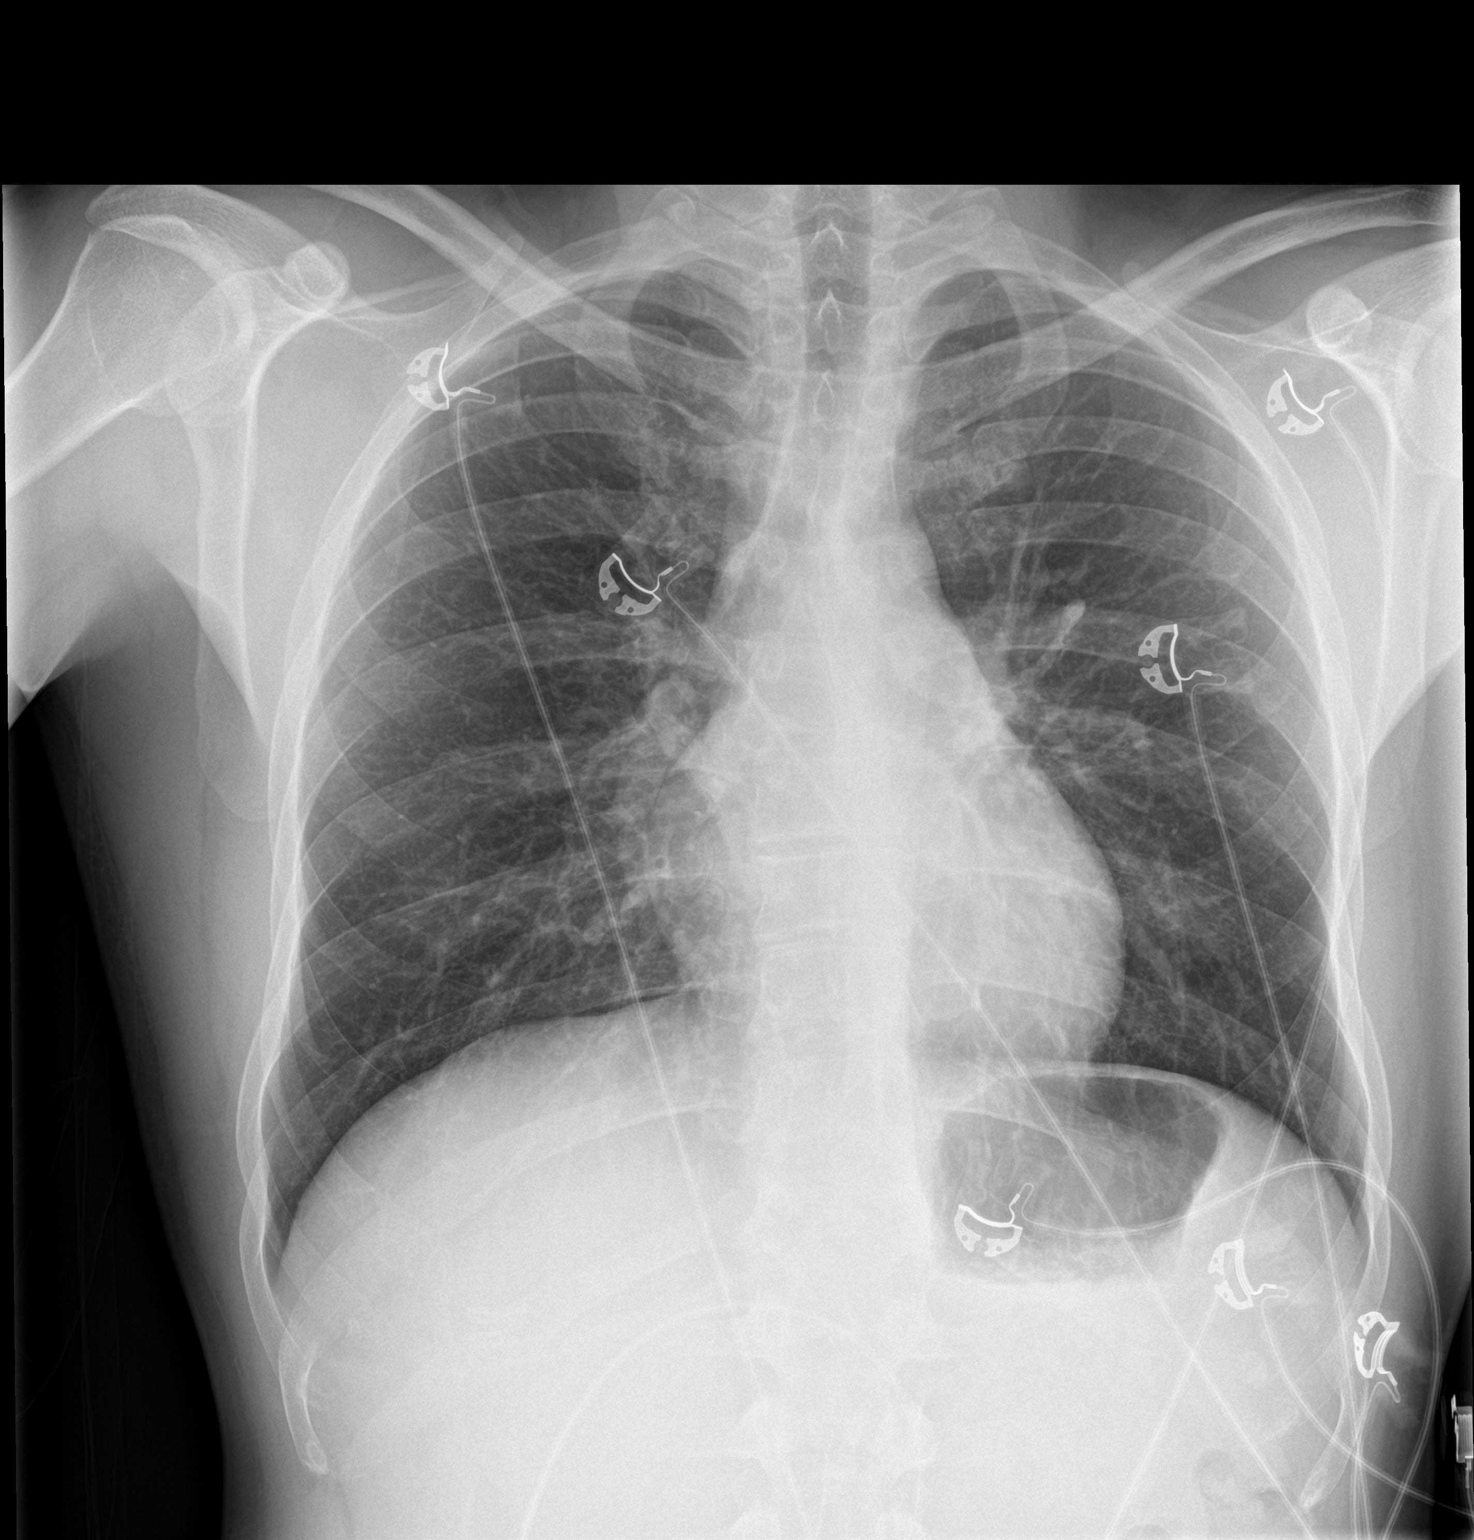

[chest lat]
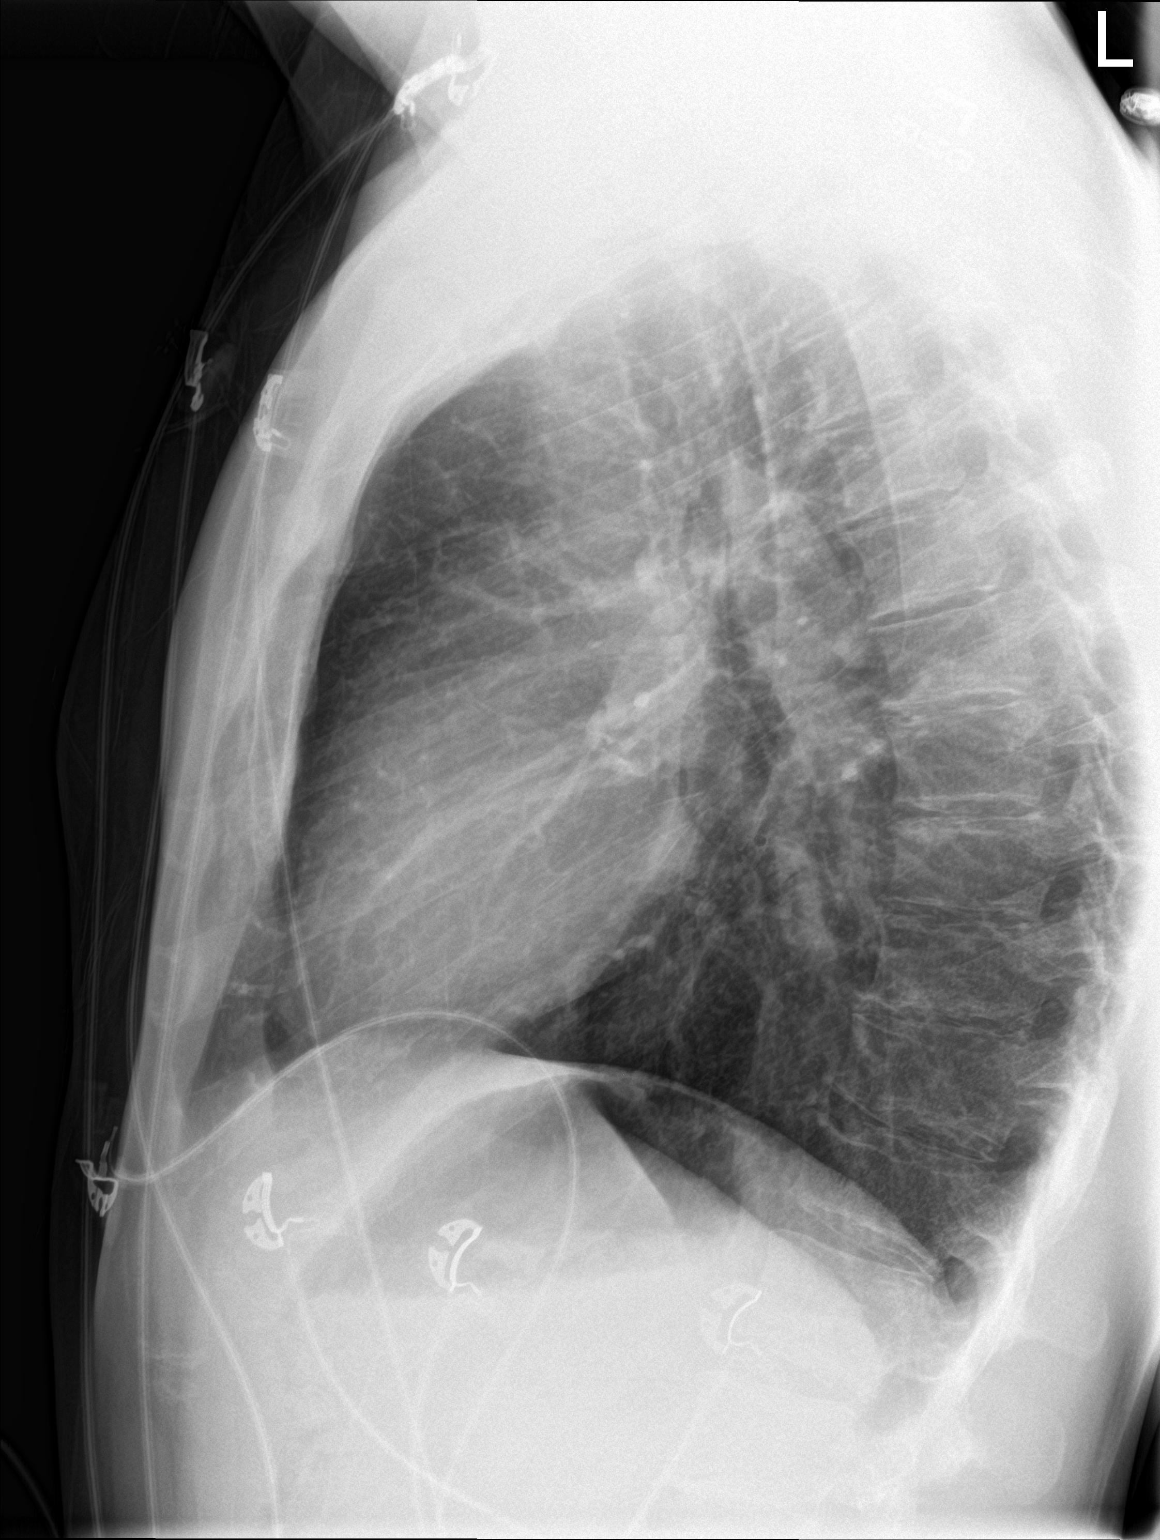

[2 of 2 positions shown; findings below may reference images not displayed]

FINDINGS: The heart size and mediastinal contours are within normal limits.
Both lungs are clear. No evidence of pneumothorax or pleural
effusion.
IMPRESSION: No active cardiopulmonary disease.

## 2018-11-30 ENCOUNTER — Encounter: Payer: Self-pay | Admitting: Emergency Medicine

## 2018-11-30 ENCOUNTER — Other Ambulatory Visit: Payer: Self-pay

## 2018-11-30 ENCOUNTER — Emergency Department
Admission: EM | Admit: 2018-11-30 | Discharge: 2018-12-01 | Disposition: A | Discharge status: Other Acute Inpatient Hospital | Payer: Commercial Managed Care - PPO | Attending: Emergency Medicine | Admitting: Emergency Medicine

## 2018-11-30 DIAGNOSIS — F419 Anxiety disorder, unspecified: Secondary | ICD-10-CM

## 2018-11-30 DIAGNOSIS — Z03818 Encounter for observation for suspected exposure to other biological agents ruled out: Secondary | ICD-10-CM | POA: Insufficient documentation

## 2018-11-30 DIAGNOSIS — F17210 Nicotine dependence, cigarettes, uncomplicated: Secondary | ICD-10-CM | POA: Insufficient documentation

## 2018-11-30 DIAGNOSIS — F1010 Alcohol abuse, uncomplicated: Secondary | ICD-10-CM | POA: Insufficient documentation

## 2018-11-30 DIAGNOSIS — F329 Major depressive disorder, single episode, unspecified: Secondary | ICD-10-CM | POA: Insufficient documentation

## 2018-11-30 DIAGNOSIS — J45909 Unspecified asthma, uncomplicated: Secondary | ICD-10-CM | POA: Insufficient documentation

## 2018-11-30 LAB — COMPREHENSIVE METABOLIC PANEL
ALT: 213 U/L — ABNORMAL HIGH (ref 0–44)
AST: 237 U/L — ABNORMAL HIGH (ref 15–41)
Albumin: 4.2 g/dL (ref 3.5–5.0)
Alkaline Phosphatase: 65 U/L (ref 38–126)
Anion gap: 11 (ref 5–15)
BUN: 12 mg/dL (ref 6–20)
CO2: 25 mmol/L (ref 22–32)
Calcium: 8.8 mg/dL — ABNORMAL LOW (ref 8.9–10.3)
Chloride: 95 mmol/L — ABNORMAL LOW (ref 98–111)
Creatinine, Ser: 0.88 mg/dL (ref 0.61–1.24)
GFR calc Af Amer: >60 mL/min (ref >60)
GFR calc non Af Amer: >60 mL/min (ref >60)
Glucose, Bld: 96 mg/dL (ref 70–99)
Potassium: 3.5 mmol/L (ref 3.5–5.1)
Sodium: 131 mmol/L — ABNORMAL LOW (ref 135–145)
Total Bilirubin: 0.8 mg/dL (ref 0.3–1.2)
Total Protein: 7.7 g/dL (ref 6.5–8.1)

## 2018-11-30 LAB — SALICYLATE LEVEL: Salicylate Lvl: <7 mg/dL (ref 2.8–30.0)

## 2018-11-30 LAB — CBC
HCT: 38.7 % — ABNORMAL LOW (ref 39.0–52.0)
Hemoglobin: 13.2 g/dL (ref 13.0–17.0)
MCH: 30.2 pg (ref 26.0–34.0)
MCHC: 34.1 g/dL (ref 30.0–36.0)
MCV: 88.6 fL (ref 80.0–100.0)
Platelets: 304 10*3/uL (ref 150–400)
RBC: 4.37 MIL/uL (ref 4.22–5.81)
RDW: 14 % (ref 11.5–15.5)
WBC: 12.2 10*3/uL — ABNORMAL HIGH (ref 4.0–10.5)
nRBC: 0 % (ref 0.0–0.2)

## 2018-11-30 LAB — ACETAMINOPHEN LEVEL: Acetaminophen (Tylenol), Serum: <10 ug/mL — ABNORMAL LOW (ref 10–30)

## 2018-11-30 LAB — ETHANOL: Alcohol, Ethyl (B): <10 mg/dL (ref <10)

## 2018-11-30 MED ORDER — BUPRENORPHINE HCL-NALOXONE HCL 8-2 MG SL SUBL: 1.0000 | SUBLINGUAL_TABLET | Freq: Every day | SUBLINGUAL | Status: DC

## 2018-11-30 MED ORDER — LORAZEPAM 2 MG/ML IJ SOLN
2.0000 mg | Freq: Once | INTRAMUSCULAR | Status: AC
  Administered 2018-11-30: 2 mg via INTRAVENOUS
  Filled 2018-11-30: qty 1

## 2018-11-30 MED ORDER — LORAZEPAM 2 MG PO TABS
2.0000 mg | ORAL_TABLET | Freq: Once | ORAL | Status: AC
  Administered 2018-11-30: 19:00:00 2 mg via ORAL
  Filled 2018-11-30: qty 1

## 2018-11-30 NOTE — ED Notes (Signed)
IVC papers arrived with  [employer]. Per IVC papers pt showed up to [employer] with paranoid delusions that the [place] was after him and that the [place] was at the hospital. IVC papers report that patient was aggressive and combative at the [employer].

## 2018-11-30 NOTE — ED Notes (Signed)
Pt compliant with PO Ativan. Pt paranoid. Speaks quietly.

## 2018-11-30 NOTE — ED Notes (Signed)
Pt. Talking to TTS on camera. 

## 2018-11-30 NOTE — BH Assessment (Signed)
Assessment Note  [name] Roth is an 33 y.o. male. [name] Roth arrived to the ED by way of law enforcement.  He reports, "I lost my fucking mind".  I have been off my medication and it started messing me up real bad, so I took a high dosage of it, I felt real bad and I lost my mind and did some stupid shit. He report, I am a little depressed. When I'm depressed, I make stupid decisions.  He stated that he is back on his suboxone.  He reports that he takes 8 mg a day and 30 mg of Prozac.  He denied symptoms of anxiety.  He denied current symptoms of auditory or visual hallucinations. He states that he has had hallucinations in the past, but it was due to drugs.  He denied homicidal ideation or intent. He denied suicidal ideation or intent at this time.  He states that he did make suicidal statements earlier, but that he did not mean it. "I want to live for once". He reports that he uses alcohol. He reports stress from breaking up with girlfriend, feeling stupid over dumb stuff, and trying to find a place to live.    IVC paperwork reports, "The respondent wandered into [employer] today and stated that someone is trying to kill me. He stated that the [place] was after him, the [place] works at the hospital.  He stated that he takes medication for addiction and pain. The officer stated that he appeared paranoid and started to fight. He has no known prior commitments."  Diagnosis: Depression, Alcohol abuse  Past Medical History:  Past Medical History:  Diagnosis Date  . Asthma   . Broken nose   . Gastritis   . History of right knee surgery   . Skull fracture [facility])     Past Surgical History:  Procedure Laterality Date  . KNEE SURGERY     right    Family History: No family history on file.  Social History:  reports that he has been smoking cigarettes. He has been smoking about 1.00 pack per day. He has never used smokeless tobacco. He reports current alcohol use. He reports that he does not  use drugs.  Additional Social History:  Alcohol / Drug Use History of alcohol / drug use?: Yes Substance #1 Name of Substance 1: Alcohol 1 - Age of First Use: 10 1 - Amount (size/oz): "too much" 1 - Frequency: .5 to 1 gallon of Vodka 1 - Last Use / Amount: daily  CIWA: CIWA-Ar BP: 119/73 Pulse Rate: (!) 111 COWS:    Allergies:  Allergies  Allergen Reactions  . Peanut-Containing Drug Products Hives, Shortness Of Breath and Swelling    REACTION: Hives, Swelling, S.O.B.   . Peanut Butter Flavor Swelling    Home Medications: (Not in a hospital admission)   OB/GYN Status:  No LMP for male patient.  General Assessment Data Location of Assessment: [facility] ED TTS Assessment: In system Is this a Tele or Face-to-Face Assessment?: Face-to-Face Is this an Initial Assessment or a Re-assessment for this encounter?: Initial Assessment Patient Accompanied by:: N/A Language Other than English: No Living Arrangements: Homeless/Shelter What gender do you identify as?: Male Marital status: Single Living Arrangements: Alone Can pt return to current living arrangement?: Yes Admission Status: Involuntary Petitioner: Police Is patient capable of signing voluntary admission?: No Referral Source: Self/Family/Friend Insurance type: None  Medical Screening Exam ([place]) Medical Exam completed: Yes  Crisis Care Plan Living Arrangements:  Alone Legal Guardian: Other:(Self) [facility]  Education Status Is patient currently in school?: No Is the patient employed, unemployed or receiving disability?: Unemployed  Risk to self with the past 6 months Suicidal Ideation: No Has patient been a risk to self within the past 6 months prior to admission? : No Suicidal Intent: No Has patient had any suicidal intent within the past 6 months prior to admission? : No Is patient at risk for suicide?: No Suicidal Plan?: No Has  patient had any suicidal plan within the past 6 months prior to admission? : No Access to Means: No What has been your use of drugs/alcohol within the last 12 months?: Daily use of alcohol Previous Attempts/Gestures: Yes How many times?: 3 Other Self Harm Risks: Picking Triggers for Past Attempts: Unknown Intentional Self Injurious Behavior: (Picking) Family Suicide History: No Recent stressful life event(s): Other (Comment), Job Loss(Homeless, relationship problems) Persecutory voices/beliefs?: Yes Depression: Yes Depression Symptoms: Despondent Substance abuse history and/or treatment for substance abuse?: Yes Suicide prevention information given to non-admitted patients: Not applicable  Risk to Others within the past 6 months Homicidal Ideation: No Does patient have any lifetime risk of violence toward others beyond the six months prior to admission? : No Thoughts of Harm to Others: No Current Homicidal Intent: No Current Homicidal Plan: No Access to Homicidal Means: No Identified Victim: None identified History of harm to others?: No Assessment of Violence: None Noted Violent Behavior Description: denied by patient Does patient have access to weapons?: Yes (Comment)("not wherever I stay, but I do have access" - "I don't want ) Criminal Charges Pending?: Yes Describe Pending Criminal Charges: Misdomeanor Larceny Does patient have a court date: Yes Court Date: 12/17/18 Is patient on probation?: Yes(DWI)  Psychosis Hallucinations: None noted(Denied by patient) Delusions: None noted  Mental Status Report Appearance/Hygiene: In scrubs Eye Contact: Fair Motor Activity: Freedom of movement Speech: Logical/coherent Level of Consciousness: Alert Mood: Irritable Affect: Appropriate to circumstance Anxiety Level: None Thought Processes: Coherent Judgement: Partial Orientation: Appropriate for developmental age Obsessive Compulsive Thoughts/Behaviors: None  Cognitive  Functioning Concentration: Fair Memory: Recent Intact Is patient IDD: No Insight: Poor Impulse Control: Poor Appetite: Fair Have you had any weight changes? : No Change Sleep: Decreased Vegetative Symptoms: None  ADLScreening [facility]) Patient's cognitive ability adequate to safely complete daily activities?: Yes Patient able to express need for assistance with ADLs?: No Independently performs ADLs?: Yes (appropriate for developmental age)  Prior Inpatient Therapy Prior Inpatient Therapy: Yes Prior Therapy Dates: June 2020 and prior [facility] Reason for Treatment: Depression  Prior Outpatient Therapy Prior Outpatient Therapy: Yes Prior Therapy Dates: Current [facility] Reason for Treatment: Depression Does patient have an ACCT team?: No Does patient have Intensive In-House Services?  : No Does patient have [facility] services? : No  ADL Screening (condition at time of admission) Patient's cognitive ability adequate to safely complete daily activities?: Yes Is the patient deaf or have difficulty hearing?: Yes(diffuculty hearing out of left ear) Does the patient have difficulty seeing, even when wearing glasses/contacts?: No Does the patient have difficulty concentrating, remembering, or making decisions?: No Patient able to express need for assistance with ADLs?: No Does the patient have difficulty dressing or bathing?: No Independently performs ADLs?: Yes (appropriate for developmental age) Does the patient have difficulty walking or climbing stairs?: No Weakness of Legs: None Weakness of Arms/Hands: None  Home  Assistive Devices/Equipment Home Assistive Devices/Equipment: None    Abuse/Neglect Assessment (Assessment to be complete while patient is alone) Abuse/Neglect Assessment Can Be Completed: (Denied by patient)     Advance Directives (For Healthcare) Does Patient Have  a Medical Advance Directive?: No Would patient like information on creating a medical advance directive?: No - Patient declined          Disposition:  Disposition Initial Assessment Completed for this Encounter: Yes  On Site Evaluation by:   Reviewed with Physician:    Justice DeedsKeisha Eshani Maestre 11/30/2018 10:52 PM

## 2018-11-30 NOTE — ED Notes (Signed)
Hourly rounding reveals patient in room talking to unseen individuals. Stable, in no acute distress. Q15 minute rounds and monitoring via [employer] to continue.

## 2018-11-30 NOTE — ED Notes (Signed)
Pt. Transferred to [facility] from ED to room 3 after screening for contraband. Report to include Situation, Background, Assessment and Recommendations from [facility]. Pt. Oriented to unit including Q15 minute rounds as well as the security cameras for their protection. Patient is alert and oriented, warm and dry in no acute distress. Patient denies SI and HI. Patient states he hears voices without command and has visions at times but does not share what they are. Pt. Encouraged to let me know if needs arise.

## 2018-11-30 NOTE — ED Provider Notes (Signed)
[facility] Emergency Department Provider Note ____________________________________________   First MD Initiated Contact with Patient 11/30/18 1812     (approximate)  I have reviewed the triage vital signs and the nursing notes.   HISTORY  Chief Complaint Psychiatric Evaluation  Level 5 caveat: History of present illness limited due to unreliable historian  HPI [name] Roth is a 33 y.o. male with PMH as noted below who presents stating he "freaked out" after he stopped taking Prozac and then restarted it.  The patient presents under involuntary commitment with the police.  Apparently, he showed up to an [employer] and was paranoid and expressing delusions about [place] being after him.  The patient states that he is no longer having these thoughts but does feel very anxious.  He also states that he missed his Suboxone today and is requesting his normal dose.  He denies any SI or HI.  Past Medical History:  Diagnosis Date  . Asthma   . Broken nose   . Gastritis   . History of right knee surgery   . Skull fracture [facility](HCC)     Patient Active Problem List   Diagnosis Date Noted  . CIGARETTE SMOKER 05/12/2010    Past Surgical History:  Procedure Laterality Date  . KNEE SURGERY     right    Prior to Admission medications   Medication Sig Start Date End Date Taking? Authorizing Provider  FLUoxetine (PROZAC) 20 MG capsule Take 20 mg by mouth daily. 10/03/18  Yes [provider]    Allergies Peanut-containing drug products and Peanut butter flavor  No family history on file.  Social History Social History   Tobacco Use  . Smoking status: Current Some Day Smoker    Packs/day: 1.00    Types: Cigarettes  . Smokeless tobacco: Never Used  Substance Use Topics  . Alcohol use: Yes    Comment: states daily drinking  . Drug use: No    Review of Systems Level 5 caveat: Review of systems limited due to unreliable historian  Constitutional: No fever. Cardiovascular: Denies chest pain. Respiratory: Denies shortness of breath. Gastrointestinal: No vomiting. Skin: Negative for rash. Neurological: Negative for headache.   ____________________________________________   PHYSICAL EXAM:  VITAL SIGNS: ED Triage Vitals  Enc Vitals Group     BP 11/30/18 1730 127/87     Pulse Rate 11/30/18 1730 (!) 116     Resp 11/30/18 1730 (!) 24     Temp 11/30/18 1730 99 F (37.2 C)     Temp Source 11/30/18 1730 Oral     SpO2 11/30/18 1730 97 %     Weight 11/30/18 1726 210 lb (95.3 kg)     Height 11/30/18 1726 5\' 6"  (1.676 m)     Head Circumference --      Peak Flow --      Pain Score 11/30/18 1726 6     Pain Loc --      Pain Edu? --      Excl. in GC? --     Constitutional: Alert and oriented.  Anxious appearing but in no acute distress. Eyes: Conjunctivae are normal.  Head: Atraumatic. Nose: No congestion/rhinnorhea. Mouth/Throat: Mucous membranes are moist.   Neck: Normal range of motion.  Cardiovascular: Good peripheral circulation. Respiratory: Normal respiratory effort.   Gastrointestinal: No distention.  Musculoskeletal: Extremities warm and well perfused.  Neurologic:  Normal speech and language. No gross focal neurologic deficits are appreciated.  Skin:  Skin is warm  and dry. No rash noted. Psychiatric: Somewhat anxious appearing and labile but verbally redirectable.  ____________________________________________   LABS (all labs ordered are listed, but only abnormal results are displayed)  Labs Reviewed  COMPREHENSIVE METABOLIC PANEL - Abnormal; Notable for the following components:      Result Value   Sodium 131 (*)    Chloride 95 (*)    Calcium 8.8 (*)    AST 237 (*)    ALT 213 (*)    All other components within normal limits  ACETAMINOPHEN LEVEL - Abnormal; Notable for the following components:   Acetaminophen (Tylenol), Serum <10 (*)    All other components within normal limits  CBC -  Abnormal; Notable for the following components:   WBC 12.2 (*)    HCT 38.7 (*)    All other components within normal limits  ETHANOL  SALICYLATE LEVEL  URINE DRUG SCREEN, QUALITATIVE ([facility] ONLY)   ____________________________________________  EKG   ____________________________________________  RADIOLOGY    ____________________________________________   PROCEDURES  Procedure(s) performed: No  Procedures  Critical Care performed: No ____________________________________________   INITIAL IMPRESSION / ASSESSMENT AND PLAN / ED COURSE  Pertinent labs & imaging results that were available during my care of the patient were reviewed by me and considered in my medical decision making (see chart for details).  33 year old male with PMH as noted above presents under involuntary commitment after he arrived in [employer] and was behaving bizarrely and expressing paranoid delusions.  The patient states that he "freaked out" after stopping his Prozac and then restarting it.  He denies any SI or HI and states he wants to live.  He is requesting his Suboxone dose as well as Ativan.  On exam, he is relatively comfortable appearing.  He was tachycardic at triage but this has resolved.  The exam is otherwise unremarkable.  I will give the patient a dose of p.o. Ativan and we will verify his Suboxone dose.  I have ordered [facility] tele-psychiatry consultation.  Anticipate that the patient will be medically cleared and disposition will be per psychiatry recommendations.  ----------------------------------------- 11:35 PM on 11/30/2018 -----------------------------------------  Patient pending [facility] evaluation.  I am signing him out to the oncoming physician Dr. Joni Fears. ____________________________________________   FINAL CLINICAL IMPRESSION(S) / ED DIAGNOSES  Final diagnoses:  Anxiety      NEW MEDICATIONS STARTED DURING THIS VISIT:  New Prescriptions   No medications on file      Note:  This document was prepared using Dragon voice recognition software and may include unintentional dictation errors.    Arta Silence, MD 11/30/18 [phone]

## 2018-11-30 NOTE — ED Triage Notes (Signed)
Pt presents to ED via [employer], per [employer] IVC papers en route. Pt reports recently restarted his Prozac and he "just freaked out on it". Pt presents intermittently tearful at this time. Per [employer] they were called by [employer]. Pt is noted to have bizarre behavior in triage at this time. Pt with disorganized thinking, asking police officers "please tell me what really happened".

## 2018-11-30 NOTE — ED Notes (Signed)
Pt. Pacing, picking at sores, anxious.

## 2018-11-30 NOTE — ED Notes (Signed)
This tech attempted to talk to pt and pts only responses were, "I saw you talking to them in the parking lot. So this is how it's going to be?"

## 2018-11-30 NOTE — ED Notes (Addendum)
2 rubber bands, 1 gray lip ring, 1 polo shirt, 1 black t shirt, 1 pair black shoes, 1 pair jeans, 1 pair underwear collected from patient at this time. 1 flip phone, 1 I phone, 1 pink lighter, 1 brown wallet.

## 2018-11-30 NOTE — ED Notes (Signed)
Hourly rounding reveals patient in day room. Anxious but stable, in no acute distress. Q15 minute rounds and monitoring via [employer] to continue.

## 2018-12-01 ENCOUNTER — Other Ambulatory Visit: Payer: Self-pay

## 2018-12-01 ENCOUNTER — Inpatient Hospital Stay
Admission: AD | Admit: 2018-12-01 | Discharge: 2018-12-02 | DRG: 897 | Disposition: A | Discharge status: Home / Self Care | Payer: No Typology Code available for payment source | Attending: Psychiatry | Admitting: Psychiatry

## 2018-12-01 DIAGNOSIS — F29 Unspecified psychosis not due to a substance or known physiological condition: Secondary | ICD-10-CM | POA: Diagnosis present

## 2018-12-01 DIAGNOSIS — F15950 Other stimulant use, unspecified with stimulant-induced psychotic disorder with delusions: Secondary | ICD-10-CM

## 2018-12-01 DIAGNOSIS — Z91010 Allergy to peanuts: Secondary | ICD-10-CM | POA: Diagnosis not present

## 2018-12-01 DIAGNOSIS — F419 Anxiety disorder, unspecified: Secondary | ICD-10-CM | POA: Diagnosis present

## 2018-12-01 DIAGNOSIS — F1110 Opioid abuse, uncomplicated: Secondary | ICD-10-CM

## 2018-12-01 DIAGNOSIS — F15150 Other stimulant abuse with stimulant-induced psychotic disorder with delusions: Principal | ICD-10-CM | POA: Diagnosis present

## 2018-12-01 DIAGNOSIS — F329 Major depressive disorder, single episode, unspecified: Secondary | ICD-10-CM | POA: Diagnosis present

## 2018-12-01 DIAGNOSIS — F22 Delusional disorders: Secondary | ICD-10-CM | POA: Diagnosis not present

## 2018-12-01 LAB — SARS CORONAVIRUS 2 BY RT PCR (HOSPITAL ORDER, PERFORMED IN ~~LOC~~ HOSPITAL LAB): SARS Coronavirus 2: NEGATIVE

## 2018-12-01 MED ORDER — BUPRENORPHINE HCL-NALOXONE HCL 8-2 MG SL SUBL: 1.0000 | SUBLINGUAL_TABLET | Freq: Two times a day (BID) | SUBLINGUAL | Status: DC

## 2018-12-01 MED ORDER — TRAZODONE HCL 50 MG PO TABS: 50.0000 mg | ORAL_TABLET | Freq: Every evening | ORAL | Status: DC | PRN

## 2018-12-01 MED ORDER — ACETAMINOPHEN 325 MG PO TABS: 650.0000 mg | ORAL_TABLET | Freq: Four times a day (QID) | ORAL | Status: DC | PRN

## 2018-12-01 MED ORDER — ALUM & MAG HYDROXIDE-SIMETH 200-200-20 MG/5ML PO SUSP: 30.0000 mL | ORAL | Status: DC | PRN

## 2018-12-01 MED ORDER — MAGNESIUM HYDROXIDE 400 MG/5ML PO SUSP: 30.0000 mL | Freq: Every day | ORAL | Status: DC | PRN

## 2018-12-01 MED ORDER — DIPHENHYDRAMINE HCL 50 MG/ML IJ SOLN
25.0000 mg | Freq: Once | INTRAMUSCULAR | Status: AC
  Administered 2018-12-01: 02:00:00 25 mg via INTRAMUSCULAR
  Filled 2018-12-01: qty 1

## 2018-12-01 MED ORDER — HALOPERIDOL LACTATE 5 MG/ML IJ SOLN
5.0000 mg | Freq: Once | INTRAMUSCULAR | Status: AC
  Administered 2018-12-01: 02:00:00 5 mg via INTRAMUSCULAR
  Filled 2018-12-01: qty 1

## 2018-12-01 MED ORDER — BUPRENORPHINE HCL-NALOXONE HCL 8-2 MG SL SUBL
1.0000 | SUBLINGUAL_TABLET | SUBLINGUAL | Status: AC
  Administered 2018-12-01: 1 via SUBLINGUAL
  Filled 2018-12-01: qty 1

## 2018-12-01 MED ORDER — BUPRENORPHINE HCL-NALOXONE HCL 8-2 MG SL SUBL
1.0000 | SUBLINGUAL_TABLET | Freq: Two times a day (BID) | SUBLINGUAL | Status: DC
  Administered 2018-12-01: 1 via SUBLINGUAL
  Filled 2018-12-01: qty 1

## 2018-12-01 NOTE — Progress Notes (Addendum)
New admit a 33 year old male  transferred from ED  Involuntarily committed  For poly- substance Abuse  With psychotic issues,  upon further assessment patient is paranoid and angry demanding Suboxone and ativan. Patient appeared withdrawn sluggish with side steps. Feeling subdued and tired., patient is not cooperating with information about his current conditions and refused UDS screen, patient stated that he is not suicidal, or any thoughts of suicide ideations or homicidal ideations , there is no signs of delusions  , patient expresses no thought of hurting himself or others, but contract for safety of self and others, body search and skin checks by 2 nurses, yielded no contraband, skin has multiple tattoos, has bilateral lower extremities edema +2 .also skin rash is present all over... Unit guide lines and expected behaviors are explained , room and unit orientation is complete, beverages are provided, and hygiene product provided as well. Patient is placed in room 311 and Dr. Weber Cooks will his primary noted.

## 2018-12-01 NOTE — ED Notes (Signed)
ED BHU [place] Is the patient under IVC or is there intent for IVC: Yes.   Is the patient medically cleared: Yes.   Is there vacancy in the ED BHU: Yes.   Is the population mix appropriate for patient: Yes.   Is the patient awaiting placement in inpatient or outpatient setting: Yes.   Has the patient had a psychiatric consult: Yes.   Survey of unit performed for contraband, proper placement and condition of furniture, tampering with fixtures in bathroom, shower, and each patient room: Yes.  ; Findings:  APPEARANCE/BEHAVIOR Calm and cooperative NEURO ASSESSMENT Orientation: oriented x3   Hallucinations: AH/VH Speech: Normal Gait: normal RESPIRATORY ASSESSMENT Even  Unlabored respirations  CARDIOVASCULAR ASSESSMENT Pulses equal   regular rate  Skin warm and dry   GASTROINTESTINAL ASSESSMENT no GI complaint EXTREMITIES Full ROM  PLAN OF CARE Provide calm/safe environment. Vital signs assessed twice daily. ED BHU Assessment once each 12-hour shift. Collaborate with TTS daily or as condition indicates. Assure the ED provider has rounded once each shift. Provide and encourage hygiene. Provide redirection as needed. Assess for escalating behavior; address immediately and inform ED provider.  Assess family dynamic and appropriateness for visitation as needed: Yes.  ; If necessary, describe findings:  Educate the patient/family about BHU procedures/visitation: Yes.  ; If necessary, describe findings:

## 2018-12-01 NOTE — ED Notes (Signed)
Pt sitting up eating breakfast at this time.

## 2018-12-01 NOTE — ED Notes (Signed)
Patient observed lying in bed with eyes closed  Even, unlabored respirations observed   NAD pt appears to be sleeping  I will continue to monitor along with every 15 minute visual observations and ongoing security camera monitoring    

## 2018-12-01 NOTE — ED Notes (Signed)
Hourly rounding reveals patient sleeping in room. No complaints, stable, in no acute distress. Q15 minute rounds and monitoring via [employer] to continue. 

## 2018-12-01 NOTE — ED Notes (Signed)
BEHAVIORAL HEALTH ROUNDING Patient sleeping: Yes.   Patient alert and oriented: eyes closed  Appears asleep Behavior appropriate: Yes.  ; If no, describe:  Nutrition and fluids offered: Yes  Toileting and hygiene offered: sleeping Sitter present: q 15 minute observations and security camera monitoring Law enforcement present: yes  ODS 

## 2018-12-01 NOTE — BH Assessment (Signed)
Patient is to be admitted to [facility] by Dr. Einar Grad.  Attending Physician will be Dr. Weber Cooks.   Patient has been assigned to room 311, by [facility] Charge Nurse Demetria.   Intake Paper Work has been signed and placed on patient chart.   ER staff is aware of the admission:  Glenda, ER Secretary    Dr. Corky Downs, ER MD   Amy T., Patient's Nurse   Butch Penny, Patient Access.

## 2018-12-01 NOTE — ED Notes (Signed)
Patient in restroom pounding on the wall responding to internal stimuli. Redirection attempted to put his shirt on and stop pounding on the wall. Patient states he will comply.

## 2018-12-01 NOTE — ED Notes (Signed)
Patient up to restroom to urinate. Assisted back to bed.

## 2018-12-01 NOTE — ED Notes (Signed)
Pt given breakfast tray at this time, pt sleeping  

## 2018-12-01 NOTE — ED Notes (Signed)
Patient repeatedly setting off the nursing call button with chair behind the door.

## 2018-12-01 NOTE — ED Notes (Signed)
Hourly rounding reveals patient in room sitting on bed. No complaints, stable, in no acute distress. Q15 minute rounds and monitoring via [employer] to continue.

## 2018-12-01 NOTE — Tx Team (Signed)
Initial Treatment Plan 12/01/2018 10:07 PM [name] A Likins [id number]    PATIENT 2STRESSORS: Financial difficulties Marital or family conflict Occupational concerns Substance abuse   PATIENT STRENGTHS: Capable of independent living Agricultural engineer for treatment/growth   PATIENT IDENTIFIED PROBLEMS:   Polysubstance Abuse    Psychosis/Paranoia     Depression/Anxiety    Bilateral lower ext. Edema +2        DISCHARGE CRITERIA:  Ability to meet basic life and health needs Adequate post-discharge living arrangements Motivation to continue treatment in a less acute level of care Reduction of life-threatening or endangering symptoms to within safe limits  PRELIMINARY DISCHARGE PLAN: Attend PHP/IOP Attend 12-step recovery group Participate in family therapy Placement in alternative living arrangements  PATIENT/FAMILY INVOLVEMENT: This treatment plan has been presented to and reviewed with the patient, [name] Roth,   The patient  have been given the opportunity to ask questions and make suggestions.  Clemens Catholic, RN 12/01/2018, 10:07 PM

## 2018-12-01 NOTE — ED Notes (Signed)

## 2018-12-01 NOTE — ED Provider Notes (Signed)
-----------------------------------------   3:15 AM on 12/01/2018 -----------------------------------------   Blood pressure 119/73, pulse (!) 111, temperature 98.7 F (37.1 C), temperature source Oral, resp. rate 16, height 5\' 6"  (1.676 m), weight 95.3 kg, SpO2 100 %.  The patient is calm and cooperative at this time.  Telemedicine psychiatry consultant finds the patient to be bizarre with tangential thinking, but believes him to be not a danger to himself or others and safe for discharge.  They have reversed his IVC.  However, the patient is still clearly responding to internal stimuli, paranoid, apparently having auditory hallucinations.  I have ordered him intramuscular Haldol to treat his psychosis symptoms, and plan to have him evaluated by face-to-face psychiatry consultant in the morning..  Awaiting disposition plan from Behavioral Medicine team.   Carrie Mew, MD 12/01/18 [phone]

## 2018-12-01 NOTE — ED Notes (Signed)
Hourly rounding reveals patient in room with his shirt off pacing with door blocked with a chair. Q15 minute rounds and monitoring via [employer] to continue.

## 2018-12-01 NOTE — ED Notes (Signed)
Hourly rounding reveals patient in day room responding to internal stimuli. No complaints, stable, in no acute distress. Q15 minute rounds and monitoring via [employer] to continue.

## 2018-12-01 NOTE — ED Notes (Signed)
BEHAVIORAL HEALTH ROUNDING Patient sleeping: No. Patient alert and oriented: yes Behavior appropriate:  He has been in and out of the BR - stating "I need my suboxone"  .  ; If no, describe:  Nutrition and fluids offered: yes Toileting and hygiene offered: Yes  Sitter present: q15 minute observations and security camera monitoring Law enforcement present: Yes  ODS

## 2018-12-01 NOTE — ED Notes (Signed)
Pt. In room with chair against the door. Patient lying on the floor.

## 2018-12-01 NOTE — ED Notes (Signed)
Patient standing on the bed with his shirt off waving his arms and talking to unseen individuals.

## 2018-12-01 NOTE — ED Notes (Signed)
Patient in restroom talking to unseen individuals.

## 2018-12-01 NOTE — Consult Note (Signed)
Patient is a 33 year old Caucasian male with a history of polysubstance abuse with multiple substances had frequent hospitalizations.  He initially was admitted for having severe paranoid thoughts and having auditory hallucinations.  He was concerned about someone trying to kill him.  He had walked into [employer] and stated that the [place] was after him.  He presented very anxious and with incoherent thinking.  He was initially seen by [facility] and his IVC was rescinded.  However patient continued to be observed to be responding to internal stimuli and needed medication to help him calm down. Patient will be needing inpatient hospitalization for further evaluation and stabilization.  An involuntary commitment paperwork will be done and patient admitted to the inpatient unit here at [facility]. Discussed his care with Dr. Corky Downs who is the ER attending at this time.

## 2018-12-01 NOTE — ED Notes (Signed)
Patient in day room doing karat moves and talking to unseen individuals. Redirection minimally effective.

## 2018-12-02 DIAGNOSIS — F1110 Opioid abuse, uncomplicated: Secondary | ICD-10-CM

## 2018-12-02 DIAGNOSIS — F15950 Other stimulant use, unspecified with stimulant-induced psychotic disorder with delusions: Secondary | ICD-10-CM

## 2018-12-02 MED ORDER — BUPRENORPHINE HCL-NALOXONE HCL 8-2 MG SL SUBL
1.0000 | SUBLINGUAL_TABLET | Freq: Every day | SUBLINGUAL | Status: DC
  Administered 2018-12-02: 1 via SUBLINGUAL

## 2018-12-02 NOTE — Progress Notes (Signed)
Recreation Therapy Notes  INPATIENT RECREATION TR PLAN  Patient Details Name: Mali A Cuen MRN: [id number] DOB: 07-27-1985 Today's Date: 12/02/2018  Rec Therapy Plan Is patient appropriate for Therapeutic Recreation?: Yes Treatment times per week: at least 3 Estimated Length of Stay: 5-7 days TR Treatment/Interventions: Group participation (Comment)  Discharge Criteria Pt will be discharged from therapy if:: Discharged Treatment plan/goals/alternatives discussed and agreed upon by:: Patient/family  Discharge Summary Short term goals set: Patient will engage in groups without prompting or encouragement from LRT x3 group sessions within 5 recreation therapy group sessions Short term goals met: Not met Reason goals not met: N/A Therapeutic equipment acquired: N/A Reason patient discharged from therapy: Discharge from hospital Pt/family agrees with progress & goals achieved: Yes Date patient discharged from therapy: 12/02/18   Terrin Meddaugh 12/02/2018, 4:31 PM

## 2018-12-02 NOTE — BHH Suicide Risk Assessment (Signed)
[facility] Discharge Suicide Risk Assessment   Principal Problem: Amphetamine and psychostimulant-induced psychosis with delusions ([place]) Discharge Diagnoses: Principal Problem:   Amphetamine and psychostimulant-induced psychosis with delusions ([place]) Active Problems:   Psychosis ([place])   Opiate abuse, continuous ([place])   Total Time spent with patient: 1 hour  Musculoskeletal: Strength & Muscle Tone: within normal limits Gait & Station: normal Patient leans: N/A  Psychiatric Specialty Exam: Review of Systems  Constitutional: Negative.   HENT: Negative.   Eyes: Negative.   Respiratory: Negative.   Cardiovascular: Negative.   Gastrointestinal: Negative.   Musculoskeletal: Negative.   Skin: Negative.   Neurological: Negative.   Psychiatric/Behavioral: Negative for depression, hallucinations, substance abuse and suicidal ideas. The patient is nervous/anxious.     Blood pressure 105/70, pulse 75, temperature 97.8 F (36.6 C), temperature source Oral, resp. rate 18, height 5\' 6"  (1.676 m), weight 87.5 kg, SpO2 100 %.Body mass index is 31.15 kg/m.  General Appearance: Disheveled  Eye Sport and exercise psychologist::  Fair  Speech:  Clear and [id number]  Volume:  Increased  Mood:  Anxious  Affect:  Congruent  Thought Process:  Goal Directed  Orientation:  Full (Time, Place, and Person)  Thought Content:  Tangential  Suicidal Thoughts:  No  Homicidal Thoughts:  No  Memory:  Immediate;   Fair Recent;   Fair Remote;   Fair  Judgement:  Fair  Insight:  Shallow  Psychomotor Activity:  Restlessness  Concentration:  Fair  Recall:  [employer] of Knowledge:Fair  Language: Fair  Akathisia:  No  Handed:  Right  AIMS (if indicated):     Assets:  Desire for Improvement  Sleep:  Number of Hours: 7.75  Cognition: WNL  ADL's:  Intact   Mental Status Per Nursing Assessment::   On Admission:  NA  Demographic Factors:  Male, Caucasian, Low socioeconomic status and Unemployed  Loss Factors: Financial  problems/change in socioeconomic status  Historical Factors: Impulsivity  Risk Reduction Factors:   Positive therapeutic relationship  Continued Clinical Symptoms:  Alcohol/Substance Abuse/Dependencies  Cognitive Features That Contribute To Risk:  Thought constriction (tunnel vision)    Suicide Risk:  Minimal: No identifiable suicidal ideation.  Patients presenting with no risk factors but with morbid ruminations; may be classified as minimal risk based on the severity of the depressive symptoms    Plan Of Care/Follow-up recommendations:  Activity:  Activity as tolerated Diet:  Regular diet Other:  Outpatient follow-up treatment at [facility].  Continue current medicine as prescribed.  Do not slip back into abusing drugs.  Patient no longer meets commitment criteria.  Patient will be discharged no new prescriptions written.  Alethia Berthold, MD 12/02/2018, 3:43 PM

## 2018-12-02 NOTE — Progress Notes (Signed)
Recreation Therapy Notes  INPATIENT RECREATION THERAPY ASSESSMENT  Patient Details Name: Mali A Everman MRN: [id number] DOB: 07/11/1985 Today's Date: 12/02/2018       Information Obtained From: Patient(Patient stated he was in pain and needed suboxone. He expalined that he was in to much pain to complete assessment)  Able to Participate in Assessment/Interview:    Patient Presentation:    Reason for Admission (Per Patient):    Patient Stressors:    Coping Skills:      Leisure Interests (2+):     Frequency of Recreation/Participation:    Awareness of Community Resources:     [employer]:     Current Use:    If no, Barriers?:    Expressed Interest in [place] of Residence:     Patient Main Form of Transportation:    Patient Strengths:     Patient Identified Areas of Improvement:     Patient Goal for Hospitalization:     Current SI (including self-harm):     Current HI:     Current AVH:    Staff Intervention Plan:    Consent to Intern Participation:    Girtrude Enslin 12/02/2018, 2:16 PM

## 2018-12-02 NOTE — BHH Group Notes (Signed)
Feelings Around Diagnosis 12/02/2018 1PM  Type of Therapy/Topic:  Group Therapy:  Feelings about Diagnosis  Participation Level:  Did Not Attend   Description of Group:   This group will allow patients to explore their thoughts and feelings about diagnoses they have received. Patients will be guided to explore their level of understanding and acceptance of these diagnoses. Facilitator will encourage patients to process their thoughts and feelings about the reactions of others to their diagnosis and will guide patients in identifying ways to discuss their diagnosis with significant others in their lives. This group will be process-oriented, with patients participating in exploration of their own experiences, giving and receiving support, and processing challenge from other group members.   Therapeutic Goals: 1. Patient will demonstrate understanding of diagnosis as evidenced by identifying two or more symptoms of the disorder 2. Patient will be able to express two feelings regarding the diagnosis 3. Patient will demonstrate their ability to communicate their needs through discussion and/or role play  Summary of Patient Progress:       Therapeutic Modalities:   Cognitive Behavioral Therapy Brief Therapy Feelings Identification    Yvette Rack, LCSW 12/02/2018 1:42 PM

## 2018-12-02 NOTE — Progress Notes (Signed)
Recreation Therapy Notes   Date: 12/02/2018  Time: 9:30 am   Location: Craft room   Behavioral response: N/A   Intervention Topic: Necessities   Discussion/Intervention: Patient did not attend group.   Clinical Observations/Feedback:  Patient did not attend group.   Geroldine Esquivias LRT/CTRS        Jhon Mallozzi 12/02/2018 10:36 AM 

## 2018-12-02 NOTE — Discharge Summary (Signed)
Physician Discharge Summary Note  Patient:  [name] Roth is an 33 y.o., male MRN:  [id number] DOB:  [birth year] Patient phone:  [phone] (home)  Patient address:   [address] [place] [address],  Total Time spent with patient: 45 minutes  Date of Admission:  12/01/2018 Date of Discharge: December 02, 2018  Reason for Admission: Admitted through the emergency room where he presented over 2 days ago with agitation evidently related to substance abuse  Principal Problem: Amphetamine and psychostimulant-induced psychosis with delusions [facility]) Discharge Diagnoses: Principal Problem:   Amphetamine and psychostimulant-induced psychosis with delusions ([place]) Active Problems:   Psychosis ([place])   Opiate abuse, continuous ([place])   Past Psychiatric History: Past history of opiate and amphetamine abuse  Past Medical History:  Past Medical History:  Diagnosis Date  . Asthma   . Broken nose   . Gastritis   . History of right knee surgery   . Skull fracture [facility])     Past Surgical History:  Procedure Laterality Date  . KNEE SURGERY     right   Family History: History reviewed. No pertinent family history. Family Psychiatric  History: None reported Social History:  Social History   Substance and Sexual Activity  Alcohol Use Yes   Comment: states daily drinking     Social History   Substance and Sexual Activity  Drug Use No    Social History   Socioeconomic History  . Marital status: Single    Spouse name: Not on file  . Number of children: Not on file  . Years of education: Not on file  . Highest education level: Not on file  Occupational History  . Not on file  Social Needs  . Financial resource strain: Not on file  . Food insecurity    Worry: Not on file    Inability: Not on file  . Transportation needs    Medical: Not on file    Non-medical: Not on file  Tobacco Use  . Smoking status: Current Some Day Smoker    Packs/day: 1.00    Types: Cigarettes   . Smokeless tobacco: Never Used  Substance and Sexual Activity  . Alcohol use: Yes    Comment: states daily drinking  . Drug use: No  . Sexual activity: Yes  Lifestyle  . Physical activity    Days per week: Not on file    Minutes per session: Not on file  . Stress: Not on file  Relationships  . Social Herbalist on phone: Not on file    Gets together: Not on file    Attends religious service: Not on file    Active member of club or organization: Not on file    Attends meetings of clubs or organizations: Not on file    Relationship status: Not on file  Other Topics Concern  . Not on file  Social History Narrative  . Not on file    Hospital Course: Patient admitted to the hospital.  Did not engage in any dangerous aggressive or suicidal behavior.  On interview his only interest was being given the Suboxone that he had recently been provided in [facility].  Patient denies any suicidal or homicidal thought at all.  Denies any hallucinations.  He is equivocal about his belief that someone had been following him outside the hospital but I think he realizes that it is not something that he need to be afraid of anymore.  I work on trying  to do some intervention with him to get him to admit that his substance abuse problem is out of control but the patient minimizes and denies it.  At this point there does not seem to be any sign of acute dangerousness.  Patient does not meet commitment criteria.  He is requesting discharge with a plan [facility] he says he can get treatment from the same provider who had given him the Suboxone.  Given that I confirmed he had a few days ago I will give him a single dose of 8 mg of Suboxone here in the hospital.  He can then be discharged no new prescriptions written.  Strongly encouraged to redouble his efforts to stay sober.  Physical Findings: AIMS: Facial and Oral Movements Muscles of Facial Expression: None, normal Lips and  Perioral Area: None, normal Jaw: None, normal Tongue: None, normal,Extremity Movements Upper (arms, wrists, hands, fingers): None, normal Lower (legs, knees, ankles, toes): None, normal, Trunk Movements Neck, shoulders, hips: None, normal, Overall Severity Severity of abnormal movements (highest score from questions above): None, normal Incapacitation due to abnormal movements: None, normal Patient's awareness of abnormal movements (rate only patient's report): No Awareness, Dental Status Current problems with teeth and/or dentures?: No Does patient usually wear dentures?: No  CIWA:  CIWA-Ar Total: 2 COWS:  COWS Total Score: 2  Musculoskeletal: Strength & Muscle Tone: within normal limits Gait & Station: normal Patient leans: N/A  Psychiatric Specialty Exam: Physical Exam  Nursing note and vitals reviewed. Constitutional: He appears well-developed and well-nourished.  HENT:  Head: Normocephalic and atraumatic.  Eyes: Pupils are equal, round, and reactive to light. Conjunctivae are normal.  Neck: Normal range of motion.  Cardiovascular: Regular rhythm and normal heart sounds.  Respiratory: Effort normal.  GI: Soft.  Musculoskeletal: Normal range of motion.  Neurological: He is alert.  Skin: Skin is warm and dry.  Psychiatric: His speech is normal and behavior is normal. Judgment and thought content normal. His mood appears anxious. Cognition and memory are normal.    Review of Systems  Constitutional: Negative.   HENT: Negative.   Eyes: Negative.   Respiratory: Negative.   Cardiovascular: Negative.   Gastrointestinal: Negative.   Musculoskeletal: Negative.   Skin: Negative.   Neurological: Negative.   Psychiatric/Behavioral: The patient is nervous/anxious.     Blood pressure 105/70, pulse 75, temperature 97.8 F (36.6 C), temperature source Oral, resp. rate 18, height 5\' 6"  (1.676 m), weight 87.5 kg, SpO2 100 %.Body mass index is 31.15 kg/m.  General Appearance:  Disheveled  Eye Contact:  Fair  Speech:  Clear and Coherent  Volume:  Increased  Mood:  Anxious  Affect:  Congruent  Thought Process:  Coherent  Orientation:  Full (Time, Place, and Person)  Thought Content:  Logical  Suicidal Thoughts:  No  Homicidal Thoughts:  No  Memory:  Immediate;   Fair Recent;   Poor Remote;   Fair  Judgement:  Fair  Insight:  Shallow  Psychomotor Activity:  Restlessness  Concentration:  Concentration: Fair  Recall:  FiservFair  Fund of Knowledge:  Fair  Language:  Fair  Akathisia:  No  Handed:  Right  AIMS (if indicated):     Assets:  Desire for Improvement  ADL's:  Intact  Cognition:  WNL  Sleep:  Number of Hours: 7.75     Have you used any form of tobacco in the last 30 days? (Cigarettes, Smokeless Tobacco, Cigars, and/or Pipes): Yes  Has this patient  used any form of tobacco in the last 30 days? (Cigarettes, Smokeless Tobacco, Cigars, and/or Pipes) Yes, Yes, A prescription for an FDA-approved tobacco cessation medication was offered at discharge and the patient refused  Blood Alcohol level:  Lab Results  Component Value Date   ETH <10 11/30/2018   ETH 16 (H) 03/02/2015    Metabolic Disorder Labs:  No results found for: HGBA1C, MPG No results found for: PROLACTIN No results found for: CHOL, TRIG, HDL, CHOLHDL, VLDL, LDLCALC  See Psychiatric Specialty Exam and Suicide Risk Assessment completed by Attending Physician prior to discharge.  Discharge destination:  Home  Is patient on multiple antipsychotic therapies at discharge:  No   Has Patient had three or more failed trials of antipsychotic monotherapy by history:  No  Recommended Plan for Multiple Antipsychotic Therapies: NA  Discharge Instructions    Diet - low sodium heart healthy   Complete by: As directed    Increase activity slowly   Complete by: As directed      Allergies as of 12/02/2018      Reactions   Peanut-containing Drug Products Hives, Shortness Of Breath, Swelling       Peanut Butter Flavor Swelling      Medication List    TAKE these medications     Indication  buprenorphine-naloxone 8-2 mg Subl SL tablet Commonly known as: SUBOXONE Place 1 tablet under the tongue 2 (two) times a day.  Indication: Opioid Dependence   FLUoxetine 20 MG capsule Commonly known as: PROZAC Take 20 mg by mouth daily.  Indication: Depression        Follow-up recommendations:  Activity:  Activity as tolerated Diet:  Regular diet Other:  Follow-up with outpatient psychiatric provider.  Return to emergency room if suicidality occurs.  Make every effort to avoid relapse into substance abuse  Comments: Patient is calm lucid and agrees to the above plan.  No longer meets commitment criteria.  No prescriptions given at discharge as he indicates that he has an outpatient provider.  Signed: Mordecai RasmussenJohn Clapacs, MD 12/02/2018, 3:46 PM

## 2018-12-02 NOTE — B[facility] Suicide Risk Assessment (Signed)
[facility] INPATIENT:  Family/Significant Other Suicide Prevention Education  Suicide Prevention Education:  Patient Refusal for Family/Significant Other Suicide Prevention Education: The patient [name] Roth has refused to provide written consent for family/significant other to be provided Family/Significant Other Suicide Prevention Education during admission and/or prior to discharge.  Physician notified.  SPE completed with pt, as pt refused to consent to family contact. SPI pamphlet provided to pt and pt was encouraged to share information with support network, ask questions, and talk about any concerns relating to SPE. Pt denies access to guns/firearms and verbalized understanding of information provided. Mobile Crisis information also provided to pt.   Rozann Lesches 12/02/2018, 9:45 AM

## 2018-12-02 NOTE — Progress Notes (Signed)
Patient ID: [name] Roth, male   DOB: [birth year], 33 y.o.   MRN: [id number]  Discharge Note:  Patient denies SI/HI/AVH at this time. Discharge instructions, AVS, prescriptions, and transition record gone over with patient. Patient agrees to comply with medication management, follow-up visit, and outpatient therapy. Patient belongings returned to patient. Patient questions and concerns addressed and answered. Patient ambulatory off unit. Patient discharged to [facility] via [place] [facility] taxicab services.
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
## 2018-12-02 NOTE — H&P (Signed)
Psychiatric Admission Assessment Adult  Patient Identification: [name] Roth MRN:  [id number] Date of Evaluation:  12/02/2018 Chief Complaint:  Psychosis Principal Diagnosis: Amphetamine and psychostimulant-induced psychosis with delusions (HCC) Diagnosis:  Principal Problem:   Amphetamine and psychostimulant-induced psychosis with delusions (HCC) Active Problems:   Psychosis (HCC)   Opiate abuse, continuous (HCC)  History of Present Illness: Patient seen chart reviewed.  This is a patient with a history of substance abuse who presented to our emergency room after police were called to a local restaurant because he was acting bizarrely.  On presentation to the emergency room patient claimed that he was being followed by the "[place]".  [facility]ially [facility] who did not believe he needed hospitalization but because of his continued agitation he was reassessed and admitted to the psych ward.  On evaluation today I find a gentleman who is been in bed most of the day but who is easily arousable to come out and talk with me.  He denies being depressed denies depression denies suicidality denies hopelessness.  He admits that he had not slept for about a day.  He said that he had come from his [place] in [place] over here to [place] because he wanted to visit a friend.  He was evasive in talking about who was "out to get them".  He tells me however that he is no longer afraid enough of them that he feels uncomfortable leaving the hospital.  Patient is extremely focused on getting Suboxone.  Apparently somebody in [place] prescribed him 4 days of Suboxone 4 days ago and the patient is insisting that he continue to get that medicine.  He denies that he had been abusing any other drugs.  We did not somehow get a urine drug screen on a patient with an extensive and well-documented history of drug abuse so I do not have any evidence to the contrary.  Patient is requesting discharge stating  [place] will go [place] [facility] to follow-up with his local provider there. Associated Signs/Symptoms: Depression Symptoms:  anxiety, disturbed sleep, (Hypo) Manic Symptoms:  Distractibility, Irritable Mood, Anxiety Symptoms:  Excessive Worry, Psychotic Symptoms:  Paranoia, PTSD Symptoms: Negative Total Time spent with patient: 1 hour  Past Psychiatric History: Patient has a long history of mental health problems with almost all of it in the documentation being ascribed to substance abuse.  I see that he did have an admission in 2012 when he was prescribed mood stabilizers.  More recent treatment has been all focused on his history of methamphetamine and opiate abuse.  Patient denies any history of suicide attempts.  He tells me that he is currently prescribed Prozac in addition to the Suboxone by the doctor in [place].  Is the patient at risk to self? No.  Has the patient been a risk to self in the past 6 months? No.  Has the patient been a risk to self within the distant past? No.  Is the patient a risk to others? No.  Has the patient been a risk to others in the past 6 months? No.  Has the patient been a risk to others within the distant past? No.   Prior Inpatient Therapy:   Prior Outpatient Therapy:    Alcohol Screening: 1. How often do you have a drink containing alcohol?: Monthly or less 2. How many drinks containing alcohol do you have on a typical day when you are drinking?: 3 or 4 3. How  often do you have six or more drinks on one occasion?: Monthly AUDIT-C Score: 4 4. How often during the last year have you found that you were not able to stop drinking once you had started?: Less than monthly 5. How often during the last year have you failed to do what was normally expected from you becasue of drinking?: Less than monthly 6. How often during the last year have you needed a first drink in the morning to get yourself going after a heavy drinking session?: Less than  monthly 7. How often during the last year have you had a feeling of guilt of remorse after drinking?: Less than monthly 8. How often during the last year have you been unable to remember what happened the night before because you had been drinking?: Less than monthly 9. Have you or someone else been injured as a result of your drinking?: No 10. Has a relative or friend or a doctor or another health worker been concerned about your drinking or suggested you cut down?: No Alcohol Use Disorder Identification Test Final Score (AUDIT): 9 Alcohol Brief Interventions/Follow-up: Alcohol Education Substance Abuse History in the last 12 months:  Yes.   Consequences of Substance Abuse: Medical Consequences:  Multiple visits to emergency rooms.  Looks to me like he has been scratching all over consistent with amphetamine abuse as well as possibly opiate withdrawal Previous Psychotropic Medications: Yes  Psychological Evaluations: Yes  Past Medical History:  Past Medical History:  Diagnosis Date   Asthma    Broken nose    Gastritis    History of right knee surgery    Skull fracture ([place])     Past Surgical History:  Procedure Laterality Date   KNEE SURGERY     right   Family History: History reviewed. No pertinent family history. Family Psychiatric  History: Denies any Tobacco Screening: Have you used any form of tobacco in the last 30 days? (Cigarettes, Smokeless Tobacco, Cigars, and/or Pipes): Yes Tobacco use, Select all that apply: 5 or more cigarettes per day Are you interested in Tobacco Cessation Medications?: Yes, will notify MD for an order Counseled patient on smoking cessation including recognizing danger situations, developing coping skills and basic information about quitting provided: Yes Social History:  Social History   Substance and Sexual Activity  Alcohol Use Yes   Comment: states daily drinking     Social History   Substance and Sexual Activity  Drug Use No     Additional Social History: Marital status: Divorced Divorced, when?: Pt reports "you'd be better off asking me when Jesus came". Additional relationship information: Pt reports that he divorced "because I married an evil woman". Are you sexually active?: Yes What is your sexual orientation?: Heterosexual Has your sexual activity been affected by drugs, alcohol, medication, or emotional stress?: Pt denies. Does patient have children?: No                         Allergies:   Allergies  Allergen Reactions   Peanut-Containing Drug Products Hives, Shortness Of Breath and Swelling        Peanut Butter Flavor Swelling   Lab Results:  Results for orders placed or performed during the hospital encounter of 11/30/18 (from the past 48 hour(s))  Comprehensive metabolic panel     Status: Abnormal   Collection Time: 11/30/18  5:22 PM  Result Value Ref Range   Sodium 131 (L) 135 - 145 mmol/L   Potassium  3.5 3.5 - 5.1 mmol/L   Chloride 95 (L) 98 - 111 mmol/L   CO2 25 22 - 32 mmol/L   Glucose, Bld 96 70 - 99 mg/dL   BUN 12 6 - 20 mg/dL   Creatinine, Ser 3.080.88 0.61 - 1.24 mg/dL   Calcium 8.8 (L) 8.9 - 10.3 mg/dL   Total Protein 7.7 6.5 - 8.1 g/dL   Albumin 4.2 3.5 - 5.0 g/dL   AST 657237 (H) 15 - 41 U/L   ALT 213 (H) 0 - 44 U/L   Alkaline Phosphatase 65 38 - 126 U/L   Total Bilirubin 0.8 0.3 - 1.2 mg/dL   GFR calc non Af Amer >60 >60 mL/min   GFR calc Af Amer >60 >60 mL/min   Anion gap 11 5 - 15    Comment: Performed at [facility], [address] [place], [place] [address]  Ethanol     Status: None   Collection Time: 11/30/18  5:22 PM  Result Value Ref Range   Alcohol, Ethyl (B) <10 <10 mg/dL    Comment: (NOTE) Lowest detectable limit for serum alcohol is 10 mg/dL. For medical purposes only. Performed at [facility], [address] [place], [place] [address]   Salicylate level     Status: None   Collection Time: 11/30/18  5:22 PM  Result Value Ref  Range   Salicylate Lvl <7.0 2.8 - 30.0 mg/dL    Comment: Performed at [facility], [address] [place], [place] [address]  Acetaminophen level     Status: Abnormal   Collection Time: 11/30/18  5:22 PM  Result Value Ref Range   Acetaminophen (Tylenol), Serum <10 (L) 10 - 30 ug/mL    Comment: (NOTE) Therapeutic concentrations vary significantly. A range of 10-30 ug/mL  may be an effective concentration for many patients. However, some  are best treated at concentrations outside of this range. Acetaminophen concentrations >150 ug/mL at 4 hours after ingestion  and >50 ug/mL at 12 hours after ingestion are often associated with  toxic reactions. Performed at [facility], [address] [place], [place] [address]   cbc     Status: Abnormal   Collection Time: 11/30/18  5:22 PM  Result Value Ref Range   WBC 12.2 (H) 4.0 - 10.5 K/uL   RBC 4.37 4.22 - 5.81 MIL/uL   Hemoglobin 13.2 13.0 - 17.0 g/dL   HCT 72.538.7 (L) 36.639.0 - 44.052.0 %   MCV 88.6 80.0 - 100.0 fL   MCH 30.2 26.0 - 34.0 pg   MCHC 34.1 30.0 - 36.0 g/dL   RDW 34.714.0 42.511.5 - 95.615.5 %   Platelets 304 150 - 400 K/uL   nRBC 0.0 0.0 - 0.2 %    Comment: Performed at [facility], [address] [place], [place] [address]  SARS Coronavirus 2 (CEPHEID - Performed in [facility] hospital lab), Hosp Order     Status: None   Collection Time: 12/01/18 12:29 PM   Specimen: Nasopharyngeal Swab  Result Value Ref Range   SARS Coronavirus 2 NEGATIVE NEGATIVE    Comment: (NOTE) If result is NEGATIVE SARS-CoV-2 target nucleic acids are NOT DETECTED. The SARS-CoV-2 RNA is generally detectable in upper and lower  respiratory specimens during the acute phase of infection. The lowest  concentration of SARS-CoV-2 viral copies this assay can detect is 250  copies / mL. A negative result does not preclude SARS-CoV-2 infection  and should not be used as the sole  basis for treatment or other  patient management decisions.   A negative result may occur with  improper specimen collection / handling, submission of specimen other  than nasopharyngeal swab, presence of viral mutation(s) within the  areas targeted by this assay, and inadequate number of viral copies  (<250 copies / mL). A negative result must be combined with clinical  observations, patient history, and epidemiological information. If result is POSITIVE SARS-CoV-2 target nucleic acids are DETECTED. The SARS-CoV-2 RNA is generally detectable in upper and lower  respiratory specimens dur ing the acute phase of infection.  Positive  results are indicative of active infection with SARS-CoV-2.  Clinical  correlation with patient history and other diagnostic information is  necessary to determine patient infection status.  Positive results do  not rule out bacterial infection or co-infection with other viruses. If result is PRESUMPTIVE POSTIVE SARS-CoV-2 nucleic acids MAY BE PRESENT.   A presumptive positive result was obtained on the submitted specimen  and confirmed on repeat testing.  While 2019 novel coronavirus  (SARS-CoV-2) nucleic acids may be present in the submitted sample  additional confirmatory testing may be necessary for epidemiological  and / or clinical management purposes  to differentiate between  SARS-CoV-2 and other Sarbecovirus currently known to infect humans.  If clinically indicated additional testing with an alternate test  methodology [phone]) is advised. The SARS-CoV-2 RNA is generally  detectable in upper and lower respiratory sp ecimens during the acute  phase of infection. The expected result is Negative. Fact Sheet for Patients:  BoilerBrush.com.cy Fact Sheet for Healthcare Providers: https://pope.com/ This test is not yet approved or cleared by the [place] FDA and has been authorized for detection and/or diagnosis of SARS-CoV-2 by FDA under an Emergency Use  Authorization (EUA).  This EUA will remain in effect (meaning this test can be used) for the duration of the COVID-19 declaration under Section 564(b)(1) of the Act, 21 U.S.C. section 360bbb-3(b)(1), unless the authorization is terminated or revoked sooner. Performed at [facility], [address] [place], [place] [address]     Blood Alcohol level:  Lab Results  Component Value Date   ETH <10 11/30/2018   ETH 16 (H) 03/02/2015    Metabolic Disorder Labs:  No results found for: HGBA1C, MPG No results found for: PROLACTIN No results found for: CHOL, TRIG, HDL, CHOLHDL, VLDL, LDLCALC  Current Medications: Current Facility-Administered Medications  Medication Dose Route Frequency Provider Last Rate Last Dose   acetaminophen (TYLENOL) tablet 650 mg  650 mg Oral Q6H PRN Ravi, Himabindu, MD       alum & mag hydroxide-simeth (MAALOX/MYLANTA) 200-200-20 MG/5ML suspension 30 mL  30 mL Oral Q4H PRN Ravi, Himabindu, MD       buprenorphine-naloxone (SUBOXONE) 8-2 mg per SL tablet 1 tablet  1 tablet Sublingual Daily Mar Zettler T, MD       magnesium hydroxide (MILK OF MAGNESIA) suspension 30 mL  30 mL Oral Daily PRN Daleen Bo, Himabindu, MD       traZODone (DESYREL) tablet 50 mg  50 mg Oral QHS PRN Daleen Bo, Himabindu, MD       PTA Medications: Medications Prior to Admission  Medication Sig Dispense Refill Last Dose   buprenorphine-naloxone (SUBOXONE) 8-2 mg SUBL SL tablet Place 1 tablet under the tongue 2 (two) times a day.      FLUoxetine (PROZAC) 20 MG capsule Take 20 mg by mouth daily.       Musculoskeletal: Strength & Muscle Tone: within normal limits  Gait & Station: normal Patient leans: N/A  Psychiatric Specialty Exam: Physical Exam  Nursing note and vitals reviewed. Constitutional: He appears well-developed and well-nourished.  HENT:  Head: Normocephalic and atraumatic.  Eyes: Pupils are equal, round, and reactive to light. Conjunctivae are normal.  Neck:  Normal range of motion.  Cardiovascular: Normal heart sounds.  Respiratory: Effort normal.  GI: Soft.  Musculoskeletal: Normal range of motion.  Neurological: He is alert.  Skin: Skin is warm and dry.     Psychiatric: His mood appears anxious. His speech is rapid and/or pressured. He is agitated. He is not aggressive. Thought content is paranoid. He expresses impulsivity. He expresses no homicidal and no suicidal ideation. He exhibits abnormal recent memory.    Review of Systems  Constitutional: Negative.   HENT: Negative.   Eyes: Negative.   Respiratory: Negative.   Cardiovascular: Negative.   Gastrointestinal: Negative.   Musculoskeletal: Negative.   Skin: Negative.   Neurological: Negative.   Psychiatric/Behavioral: Negative for depression, hallucinations, memory loss, substance abuse and suicidal ideas. The patient is not nervous/anxious and does not have insomnia.     Blood pressure 105/70, pulse 75, temperature 97.8 F (36.6 C), temperature source Oral, resp. rate 18, height  (1.676 m), weight 87.5 kg, SpO2 100 %.Body mass index is 31.15 kg/m.  General Appearance: Disheveled  Eye Contact:  Good  Speech:  Clear and Coherent  Volume:  Increased  Mood:  Irritable  Affect:  Congruent  Thought Process:  Coherent  Orientation:  Full (Time, Place, and Person)  Thought Content:  Rumination  Suicidal Thoughts:  No  Homicidal Thoughts:  No  Memory:  Immediate;   Fair Recent;   Fair Remote;   Fair  Judgement:  Fair  Insight:  Shallow  Psychomotor Activity:  Increased  Concentration:  Concentration: Fair  Recall:  [employer] of Knowledge:  Fair  Language:  Fair  Akathisia:  No  Handed:  Right  AIMS (if indicated):     Assets:  Desire for Improvement Physical Health Resilience  ADL's:  Impaired  Cognition:  WNL  Sleep:  Number of Hours: 7.75    Treatment Plan Summary: Daily contact with patient to assess and evaluate symptoms and progress in treatment,  Medication management and Plan Patient with a history of substance abuse presented to the emergency room agitated confused with symptoms very consistent with amphetamine psychosis.  Patient today is agitated only wanting to get his Suboxone and then get back to [facility].  Denies any suicidal thoughts at all.  Denies any homicidal thoughts.  No evidence that he was having any acute dangerousness on admission.  Patient does not appear to require inpatient treatment on the psychiatric unit.  After confirming he had been given Suboxone 4 days ago I agreed to give him a one-time dose today but otherwise the patient at his own request can be discharged as he no longer meets commitment criteria.  He tells me he has a place to stay in [facility] and will follow-up at [facility] with the doctor as noted previously who had prescribed him the Suboxone.  Observation Level/Precautions:  15 minute checks  Laboratory:  UDS  Psychotherapy:    Medications:    Consultations:    Discharge Concerns:    Estimated LOS:  Other:     Physician Treatment Plan for Primary Diagnosis: Amphetamine and psychostimulant-induced psychosis with delusions (HCC) Long Term Goal(s): Improvement in symptoms so as ready for discharge  Short Term Goals: Ability  to demonstrate self-control will improve  Physician Treatment Plan for Secondary Diagnosis: Principal Problem:   Amphetamine and psychostimulant-induced psychosis with delusions (HCC) Active Problems:   Psychosis (HCC)   Opiate abuse, continuous (HCC)  Long Term Goal(s): Improvement in symptoms so as ready for discharge  Short Term Goals: Ability to identify triggers associated with substance abuse/mental health issues will improve  I certify that inpatient services furnished can reasonably be expected to improve the patient's condition.    Mordecai RasmussenJohn Tenika Keeran, MD 6/30/20203:23 PM

## 2018-12-02 NOTE — BHH Counselor (Signed)
CSW attempted to discuss aftercare with the patient but he did not appear to be focused and on track with the discussion.  CSW let patient know that she would return and follow up later in the day when patient is a little more awake. Pt became agitated and upset at this and stated several times "please, please please don't take my subaxone! I NEED it!"  CSW explained that medications are not in her purview and will follow up with psychiatrist and nurse.   Assunta Curtis, MSW, LCSW 12/02/2018 9:48 AM

## 2018-12-02 NOTE — Progress Notes (Signed)
  [facility] Adult Case Management Discharge Plan :  Will you be returning to the same living situation after discharge:  No. pt is homeless and says he will be living with a friend. At discharge, do you have transportation home?: Yes,  pt provided with a taxi voucher. Pt request to go to [facility] Do you have the ability to pay for your medications: No. referred to provider who can assist with medication  Release of information consent forms completed and in the chart;  Patient's signature needed at discharge.  Patient to Follow up at: [facility]. Go to.   Specialty: Behavioral Health Why: Please follow up at the walk in clinic on Monday-Friday at 9am-4pm. Please bring your hospital discharge paperwork with you. Thank you. Contact information: [place] [phone]          Next level of care provider has access to [place] and Suicide Prevention discussed: Yes,  with pt; pt declined family contact  Have you used any form of tobacco in the last 30 days? (Cigarettes, Smokeless Tobacco, Cigars, and/or Pipes): Yes  Has patient been referred to the Quitline?: N/A patient is not a smoker  Patient has been referred for addiction treatment: N/A  Yvette Rack, LCSW 12/02/2018, 4:04 PM

## 2018-12-02 NOTE — [facility] Suicide Risk Assessment (Signed)
[facility]   Nursing information obtained from:  Patient Demographic factors:  Living alone Current Mental Status:  NA Loss Factors:  NA Historical Factors:  NA Risk Reduction Factors:  NA  Total Time spent with patient: 1 hour Principal Problem: Amphetamine and psychostimulant-induced psychosis with delusions (HCC) Diagnosis:  Principal Problem:   Amphetamine and psychostimulant-induced psychosis with delusions (HCC) Active Problems:   Psychosis (HCC)   Opiate abuse, continuous (HCC)  Subjective Data: Patient seen chart reviewed.  Patient admitted through the emergency room where he presented with paranoia and jitteriness.  Has consistently denied suicidal or homicidal ideation.  On interview today he continues to deny any suicidal or homicidal thoughts.  Denies any current hallucinations or active psychotic symptoms.  Patient is requesting discharge stating that [facility]  Continued Clinical Symptoms:  Alcohol Use Disorder Identification Test Final Score (AUDIT): 9 The "Alcohol Use Disorders Identification Test", Guidelines for Use in Primary Care, Second Edition.  World Science writerHealth Organization [facility](WHO). Score between 0-7:  no or low risk or alcohol related problems. Score between 8-15:  moderate risk of alcohol related problems. Score between 16-19:  high risk of alcohol related problems. Score 20 or above:  warrants further diagnostic evaluation for alcohol dependence and treatment.   CLINICAL FACTORS:   Alcohol/Substance Abuse/Dependencies   Musculoskeletal: Strength & Muscle Tone: within normal limits Gait & Station: normal Patient leans: N/A  Psychiatric Specialty Exam: Physical Exam  Nursing note and vitals reviewed. Constitutional: He appears well-developed and well-nourished.  HENT:  Head: Normocephalic and atraumatic.  Eyes: Pupils are equal, round, and reactive to light. Conjunctivae are  normal.  Neck: Normal range of motion.  Cardiovascular: Regular rhythm and normal heart sounds.  Respiratory: Effort normal.  GI: Soft.  Musculoskeletal: Normal range of motion.  Neurological: He is alert.  Skin: Skin is warm and dry.  Psychiatric: His mood appears anxious. He is agitated. He is not aggressive. Cognition and memory are normal. He expresses impulsivity. He expresses no homicidal and no suicidal ideation.    Review of Systems  Constitutional: Negative.   HENT: Negative.   Eyes: Negative.   Respiratory: Negative.   Cardiovascular: Negative.   Gastrointestinal: Negative.   Musculoskeletal: Negative.   Skin: Negative.   Neurological: Negative.   Psychiatric/Behavioral: Negative for depression, hallucinations, substance abuse and suicidal ideas. The patient is not nervous/anxious.     Blood pressure 105/70, pulse 75, temperature 97.8 F (36.6 C), temperature source Oral, resp. rate 18, height 5\' 6"  (1.676 m), weight 87.5 kg, SpO2 100 %.Body mass index is 31.15 kg/m.  General Appearance: Disheveled  Eye Contact:  Fair  Speech:  Clear and Coherent  Volume:  Increased  Mood:  Anxious  Affect:  Congruent  Thought Process:  Goal Directed  Orientation:  Full (Time, Place, and Person)  Thought Content:  Logical  Suicidal Thoughts:  No  Homicidal Thoughts:  No  Memory:  Immediate;   Fair Recent;   Fair Remote;   Fair  Judgement:  Fair  Insight:  Shallow  Psychomotor Activity:  Restlessness  Concentration:  Concentration: Fair  Recall:  [employer]Fair  Fund of Knowledge:  Fair  Language:  Fair  Akathisia:  No  Handed:  Right  AIMS (if indicated):     Assets:  Desire for Improvement Resilience  ADL's:  Intact  Cognition:  WNL  Sleep:  Number of Hours: 7.75      COGNITIVE FEATURES THAT CONTRIBUTE TO  RISK:  Thought constriction (tunnel vision)    SUICIDE RISK:   Minimal: No identifiable suicidal ideation.  Patients presenting with no risk factors but with morbid  ruminations; may be classified as minimal risk based on the severity of the depressive symptoms  PLAN OF CARE: Patient appears to have presented with substance-induced problems.  Has consistently denied suicidal thoughts.  No evidence of violence or homicidal ideation.  Patient no longer meets commitment criteria.  Request discharge to follow-up in [facility].  Patient understands the risks of continued drug abuse but is denying or minimizing all of that right now.  Patient will be discharged with follow-up in [facility] no new prescriptions written.  I certify that inpatient services furnished can reasonably be expected to improve the patient's condition.   Alethia Berthold, MD 12/02/2018, 3:39 PM
